# Patient Record
Sex: Female | Born: 1945 | Race: White | Hispanic: No | Marital: Married | Smoking: Former smoker
Health system: Southern US, Community
[De-identification: ages and names within clinical notes are randomized; demographics above are authoritative.]

## PROBLEM LIST (undated history)

## (undated) DIAGNOSIS — K529 Noninfective gastroenteritis and colitis, unspecified: Secondary | ICD-10-CM

## (undated) DIAGNOSIS — C4490 Unspecified malignant neoplasm of skin, unspecified: Secondary | ICD-10-CM

## (undated) DIAGNOSIS — Z87898 Personal history of other specified conditions: Secondary | ICD-10-CM

## (undated) DIAGNOSIS — M419 Scoliosis, unspecified: Secondary | ICD-10-CM

## (undated) DIAGNOSIS — M1990 Unspecified osteoarthritis, unspecified site: Secondary | ICD-10-CM

## (undated) DIAGNOSIS — I10 Essential (primary) hypertension: Secondary | ICD-10-CM

## (undated) DIAGNOSIS — M51369 Other intervertebral disc degeneration, lumbar region without mention of lumbar back pain or lower extremity pain: Secondary | ICD-10-CM

## (undated) DIAGNOSIS — C801 Malignant (primary) neoplasm, unspecified: Secondary | ICD-10-CM

## (undated) DIAGNOSIS — K219 Gastro-esophageal reflux disease without esophagitis: Secondary | ICD-10-CM

## (undated) DIAGNOSIS — M797 Fibromyalgia: Secondary | ICD-10-CM

## (undated) DIAGNOSIS — F32A Depression, unspecified: Secondary | ICD-10-CM

## (undated) DIAGNOSIS — M5136 Other intervertebral disc degeneration, lumbar region: Secondary | ICD-10-CM

## (undated) DIAGNOSIS — E785 Hyperlipidemia, unspecified: Secondary | ICD-10-CM

## (undated) DIAGNOSIS — R9082 White matter disease, unspecified: Secondary | ICD-10-CM

## (undated) DIAGNOSIS — J329 Chronic sinusitis, unspecified: Secondary | ICD-10-CM

## (undated) DIAGNOSIS — F419 Anxiety disorder, unspecified: Secondary | ICD-10-CM

## (undated) HISTORY — PX: OTHER SURGICAL HISTORY: SHX169

## (undated) HISTORY — PX: TUBAL LIGATION: SHX77

## (undated) HISTORY — PX: INNER EAR SURGERY: SHX679

## (undated) HISTORY — PX: COLONOSCOPY: SHX174

## (undated) HISTORY — DX: Malignant (primary) neoplasm, unspecified: C80.1

## (undated) HISTORY — PX: ABDOMINAL HYSTERECTOMY: SHX81

## (undated) HISTORY — PX: HERNIA REPAIR: SHX51

---

## 1997-10-15 HISTORY — PX: ROTATOR CUFF REPAIR: SHX139

## 2004-10-15 HISTORY — PX: EYELID LACERATION REPAIR: SHX1564

## 2008-10-15 HISTORY — PX: CHOLECYSTECTOMY: SHX55

## 2008-11-06 ENCOUNTER — Emergency Department: Payer: Self-pay | Admitting: Emergency Medicine

## 2009-03-23 IMAGING — CR LEFT WRIST - COMPLETE 3+ VIEW
1 series · 4 of 4 positions shown · non-contrast
Comparison: none

REASON FOR EXAM: FALL/SWELLING AND PAIN
COMMENTS:

[Series 1: view not recorded · 0.17mm/px · 4 of 4 slices shown]
[im 1/4]
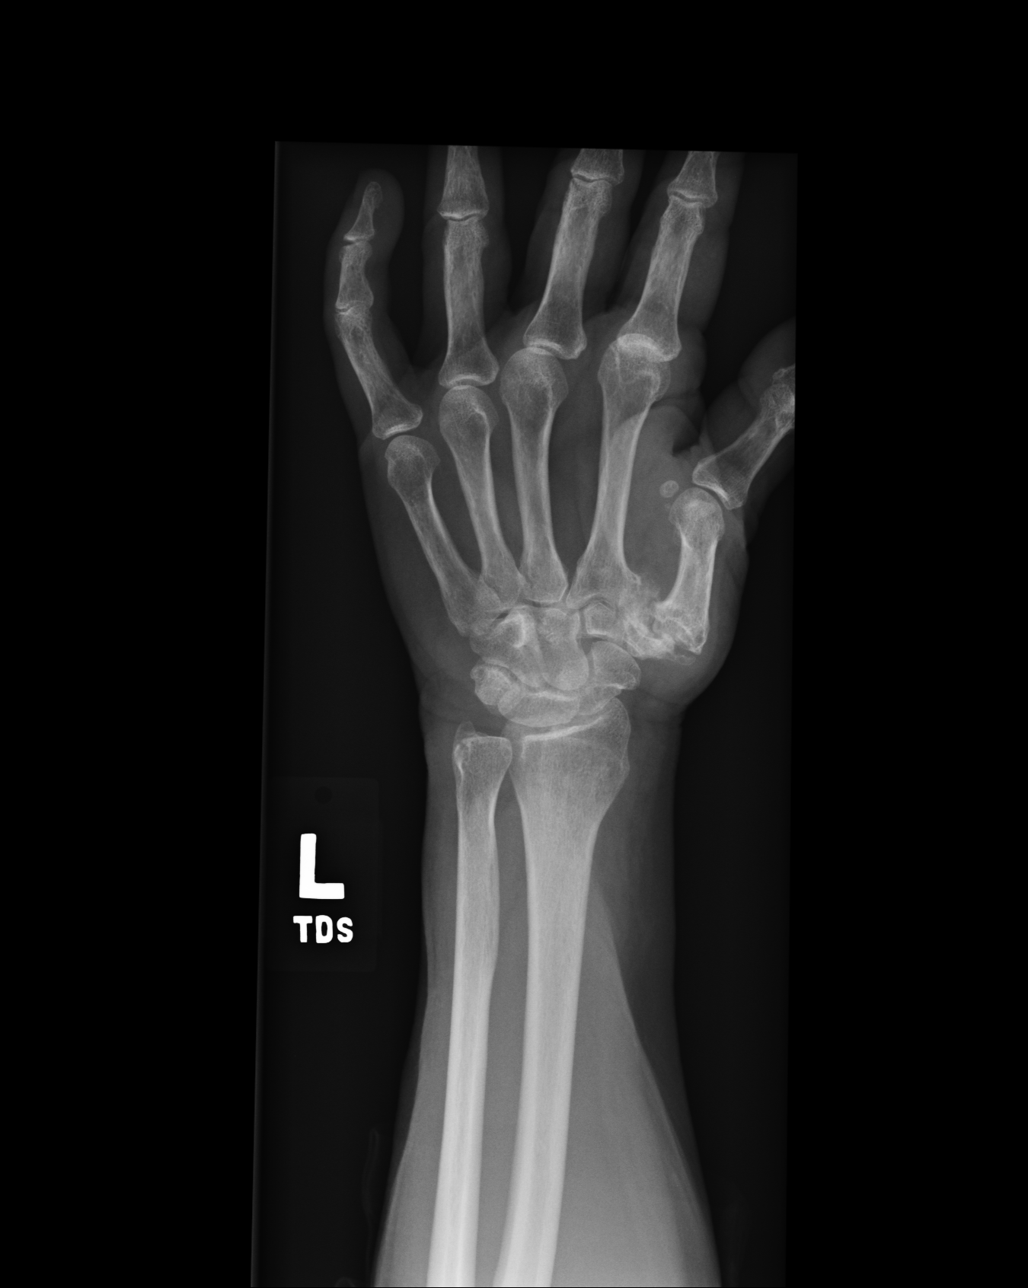
[im 2/4]
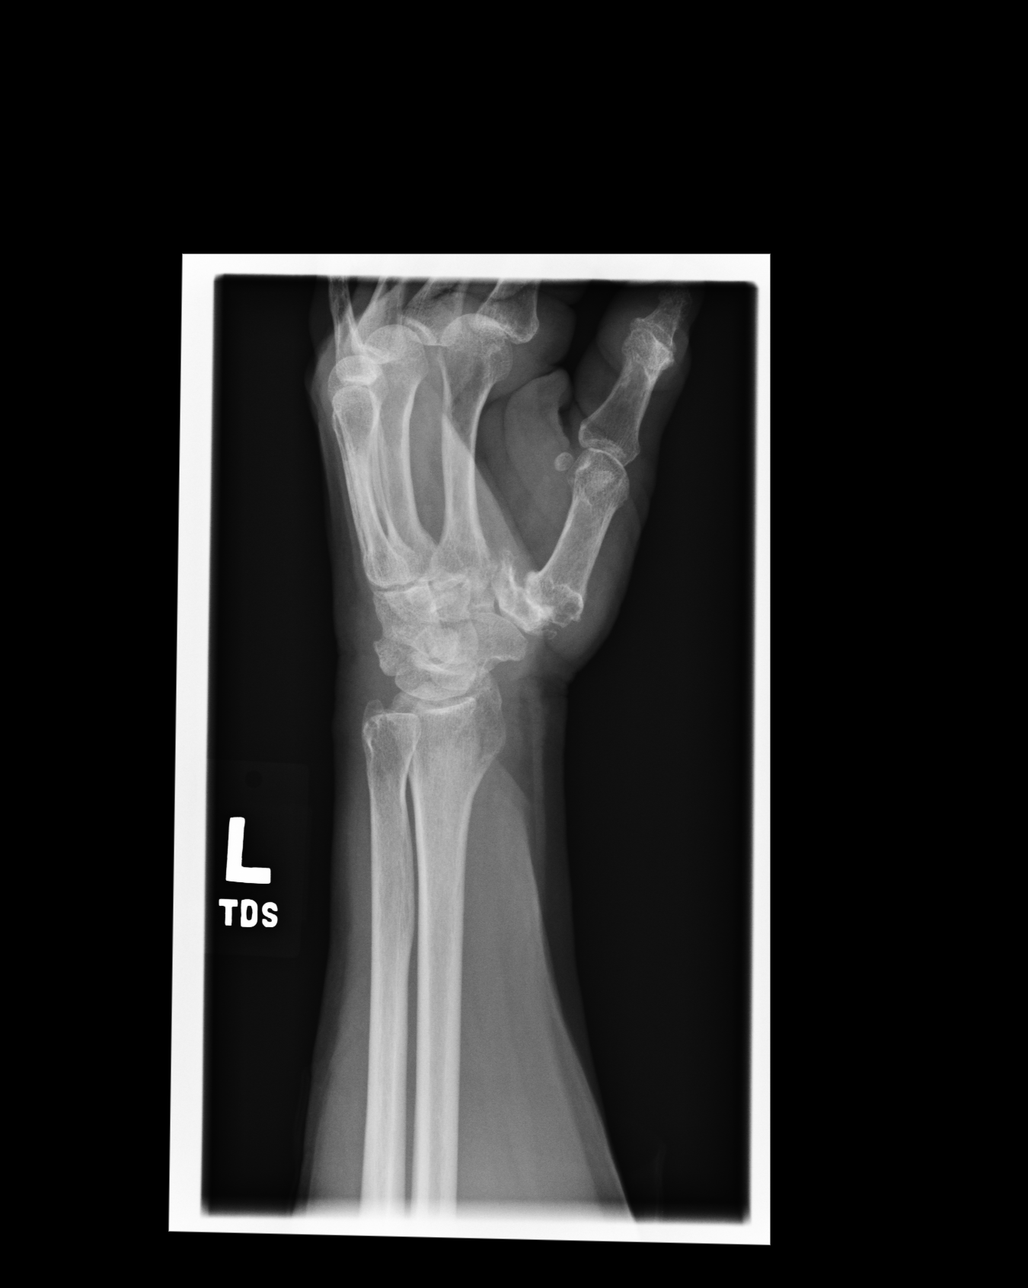
[im 3/4]
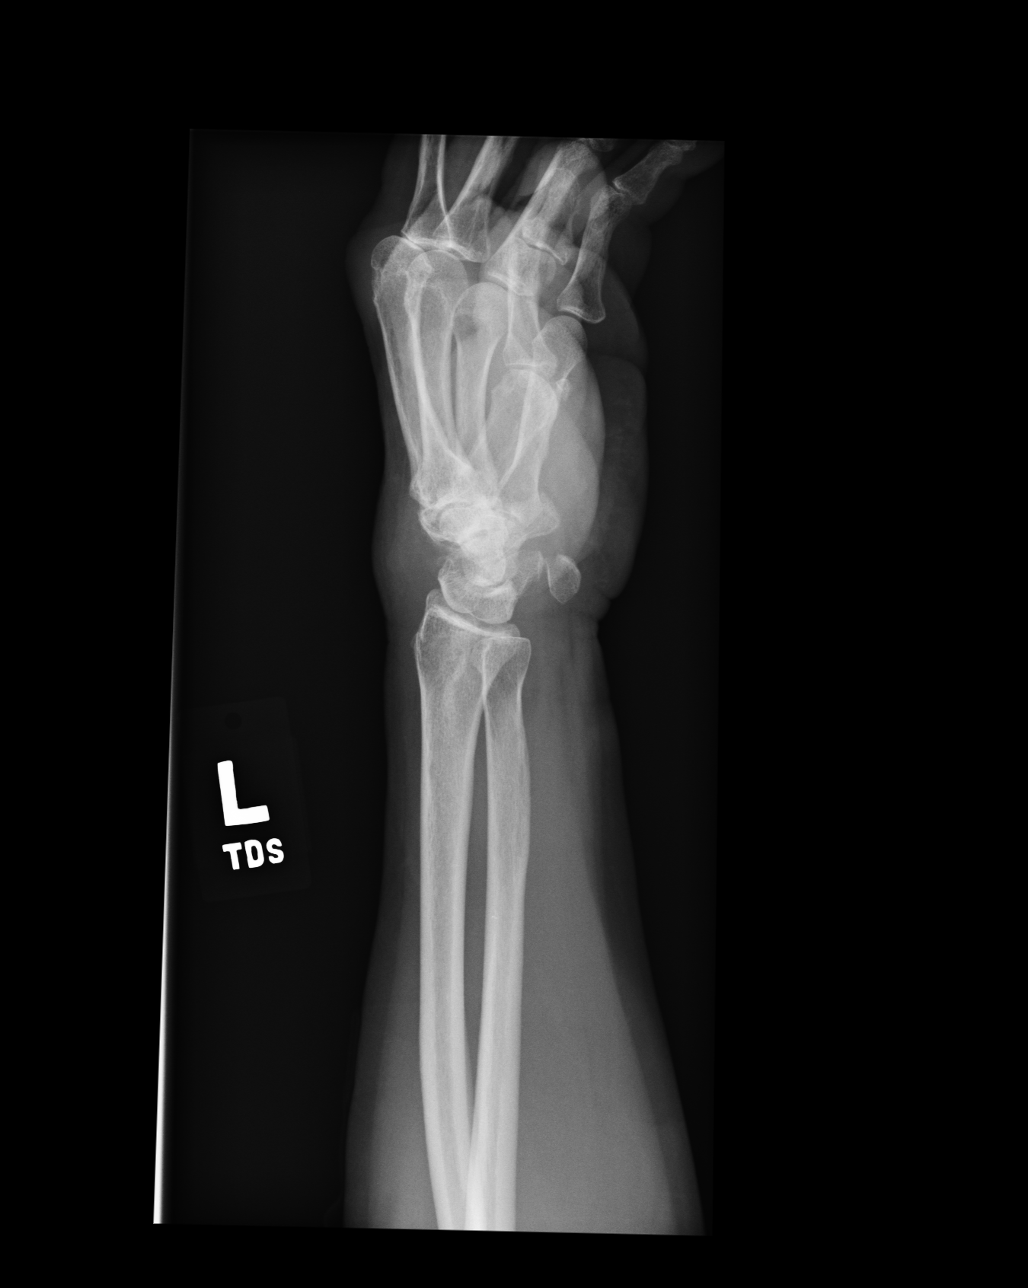
[im 4/4]
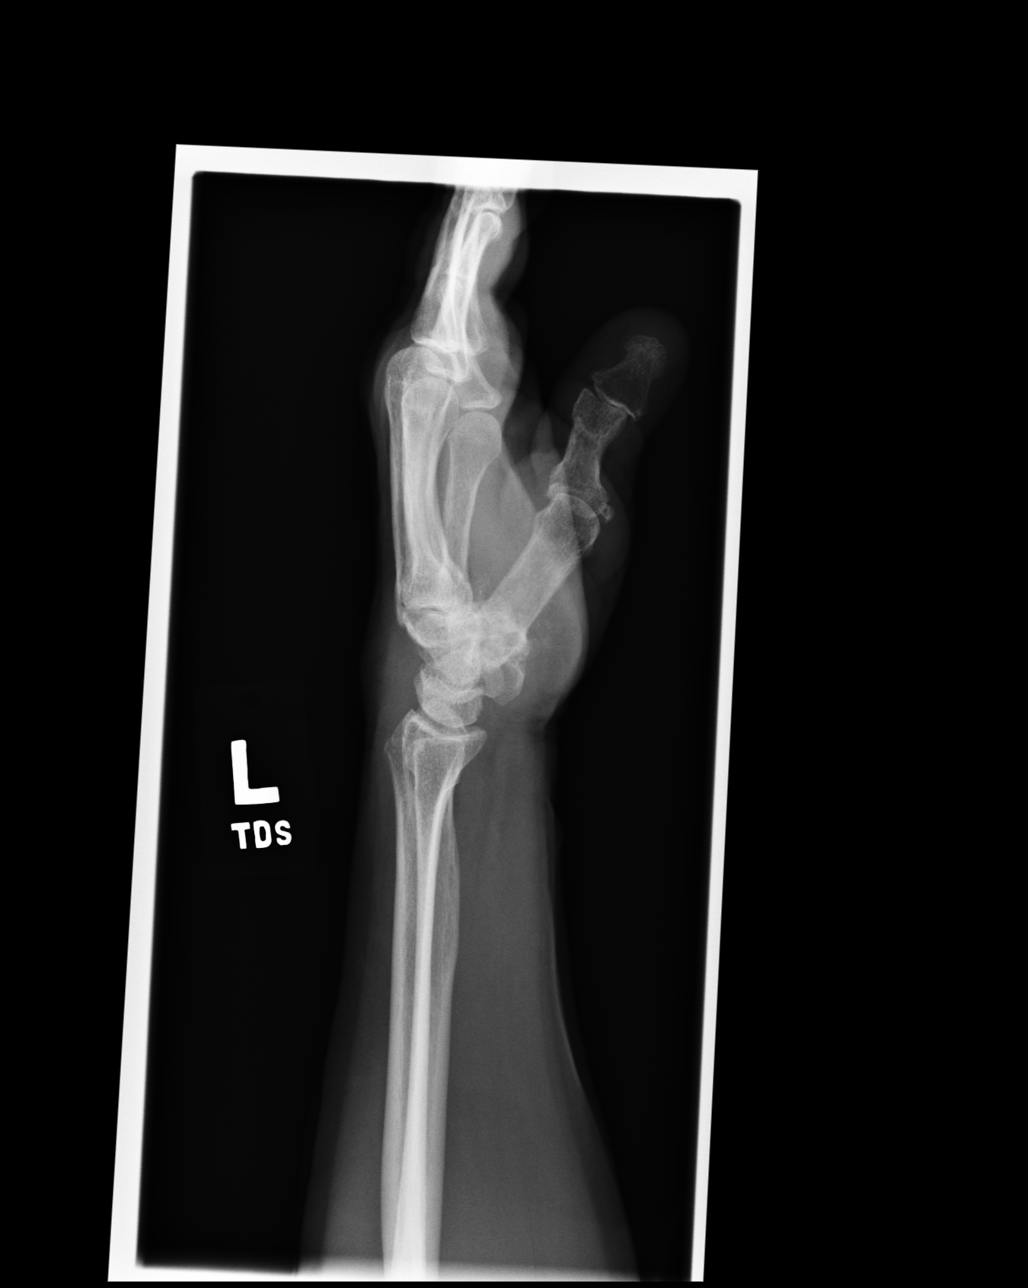

[4 of 4 positions shown; findings below may reference images not displayed]

PROCEDURE:     DXR - DXR WRIST LT COMP WITH OBLIQUES  - November 06, 2008  [DATE]

RESULT:     Distal radial fracture is present.  Distal ulna fracture is also
present. Severe degenerative changes are noted at the base of the thumb at
the first carpometacarpal joint. Fragmentation of the adjacent carpal
suggesting avascular necrosis cannot be excluded.
IMPRESSION: Subtle fractures of the distal radius and ulna.

This report was phoned to the patient's physician.  The report was phoned at
the time of the study.

## 2010-05-23 ENCOUNTER — Emergency Department: Payer: Self-pay | Admitting: Emergency Medicine

## 2013-10-15 HISTORY — PX: JOINT REPLACEMENT: SHX530

## 2013-11-12 HISTORY — PX: TOTAL HIP ARTHROPLASTY: SHX124

## 2015-09-01 DIAGNOSIS — Z9889 Other specified postprocedural states: Secondary | ICD-10-CM

## 2015-09-01 HISTORY — DX: Other specified postprocedural states: Z98.890

## 2015-10-10 ENCOUNTER — Encounter: Payer: Self-pay | Admitting: Emergency Medicine

## 2015-10-10 ENCOUNTER — Ambulatory Visit
Admission: EM | Admit: 2015-10-10 | Discharge: 2015-10-10 | Disposition: A | Discharge status: Home / Self Care | Payer: Medicare Other | Attending: Family Medicine | Admitting: Family Medicine

## 2015-10-10 DIAGNOSIS — J0100 Acute maxillary sinusitis, unspecified: Secondary | ICD-10-CM | POA: Diagnosis not present

## 2015-10-10 HISTORY — DX: Essential (primary) hypertension: I10

## 2015-10-10 MED ORDER — DOXYCYCLINE HYCLATE 100 MG PO CAPS: 100.0000 mg | ORAL_CAPSULE | Freq: Two times a day (BID) | ORAL | Status: DC

## 2015-10-10 MED ORDER — GUAIFENESIN-CODEINE 100-10 MG/5ML PO SOLN: 5.0000 mL | Freq: Three times a day (TID) | ORAL | Status: DC | PRN

## 2015-10-10 MED ORDER — PREDNISONE 20 MG PO TABS: 40.0000 mg | ORAL_TABLET | Freq: Every day | ORAL | Status: DC

## 2015-10-10 NOTE — Discharge Instructions (Signed)
Take medication as prescribed. Rest.   Follow up closely with your primary care physician this week. Return to Urgent care or proceed to ER for chest pain, shortness of breath, weakness, fever, new or worsening concerns.   Sinusitis, Adult Sinusitis is redness, soreness, and inflammation of the paranasal sinuses. Paranasal sinuses are air pockets within the bones of your face. They are located beneath your eyes, in the middle of your forehead, and above your eyes. In healthy paranasal sinuses, mucus is able to drain out, and air is able to circulate through them by way of your nose. However, when your paranasal sinuses are inflamed, mucus and air can become trapped. This can allow bacteria and other germs to grow and cause infection. Sinusitis can develop quickly and last only a short time (acute) or continue over a long period (chronic). Sinusitis that lasts for more than 12 weeks is considered chronic. CAUSES Causes of sinusitis include:  Allergies.  Structural abnormalities, such as displacement of the cartilage that separates your nostrils (deviated septum), which can decrease the air flow through your nose and sinuses and affect sinus drainage.  Functional abnormalities, such as when the small hairs (cilia) that line your sinuses and help remove mucus do not work properly or are not present. SIGNS AND SYMPTOMS Symptoms of acute and chronic sinusitis are the same. The primary symptoms are pain and pressure around the affected sinuses. Other symptoms include:  Upper toothache.  Earache.  Headache.  Bad breath.  Decreased sense of smell and taste.  A cough, which worsens when you are lying flat.  Fatigue.  Fever.  Thick drainage from your nose, which often is green and may contain pus (purulent).  Swelling and warmth over the affected sinuses. DIAGNOSIS Your health care provider will perform a physical exam. During your exam, your health care provider may perform any of the  following to help determine if you have acute sinusitis or chronic sinusitis:  Look in your nose for signs of abnormal growths in your nostrils (nasal polyps).  Tap over the affected sinus to check for signs of infection.  View the inside of your sinuses using an imaging device that has a light attached (endoscope). If your health care provider suspects that you have chronic sinusitis, one or more of the following tests may be recommended:  Allergy tests.  Nasal culture. A sample of mucus is taken from your nose, sent to a lab, and screened for bacteria.  Nasal cytology. A sample of mucus is taken from your nose and examined by your health care provider to determine if your sinusitis is related to an allergy. TREATMENT Most cases of acute sinusitis are related to a viral infection and will resolve on their own within 10 days. Sometimes, medicines are prescribed to help relieve symptoms of both acute and chronic sinusitis. These may include pain medicines, decongestants, nasal steroid sprays, or saline sprays. However, for sinusitis related to a bacterial infection, your health care provider will prescribe antibiotic medicines. These are medicines that will help kill the bacteria causing the infection. Rarely, sinusitis is caused by a fungal infection. In these cases, your health care provider will prescribe antifungal medicine. For some cases of chronic sinusitis, surgery is needed. Generally, these are cases in which sinusitis recurs more than 3 times per year, despite other treatments. HOME CARE INSTRUCTIONS  Drink plenty of water. Water helps thin the mucus so your sinuses can drain more easily.  Use a humidifier.  Inhale steam 3-4 times a  day (for example, sit in the bathroom with the shower running).  Apply a warm, moist washcloth to your face 3-4 times a day, or as directed by your health care provider.  Use saline nasal sprays to help moisten and clean your sinuses.  Take  medicines only as directed by your health care provider.  If you were prescribed either an antibiotic or antifungal medicine, finish it all even if you start to feel better. SEEK IMMEDIATE MEDICAL CARE IF:  You have increasing pain or severe headaches.  You have nausea, vomiting, or drowsiness.  You have swelling around your face.  You have vision problems.  You have a stiff neck.  You have difficulty breathing.   This information is not intended to replace advice given to you by your health care provider. Make sure you discuss any questions you have with your health care provider.   Document Released: 10/01/2005 Document Revised: 10/22/2014 Document Reviewed: 10/16/2011 Elsevier Interactive Patient Education [employer] Inc[employer]

## 2015-10-10 NOTE — ED Provider Notes (Signed)
[facility]  ____________________________________________  Time seen: Approximately 5:46 PM  I have reviewed the triage vital signs and the nursing notes.   HISTORY  Chief Complaint Cough; Facial Pain; and Nasal Congestion   HPI Julia Sherman is a 69 y.o. female presents with spouse at bedside for the complaints of 1-2 weeks of runny nose, nasal congestion, sinus pressure and sinus drainage. Reports frequently getting thick yellow to green sinus drainage when blowing nose. Reports ears feel clogged, Denies hearing changes. Reports occasionally productive cough. Patient reports feeling drainage in the back of her throat. States cough is worse at night. Denies fevers.   Denies chest pain, shortness of breath, wheezing, abdominal pain, vision changes, weakness or dizziness. Reports continues to eat and drink well. Reports most recent sickness was bronchitis approximately 2 months ago treated with oral Augmentin.   Reports PCP Dr. Mylinda Latina. Reports tried to follow-up with him today but office is closed.   Past Medical History  Diagnosis Date  . Hypertension    chronic bilateral lower extremity venous stasis ulcers, followed by 1 management with Unna boots.  There are no active problems to display for this patient.   History reviewed. No pertinent past surgical history.  Current Outpatient Rx  Name  Route  Sig  Dispense  Refill  . alendronate (FOSAMAX) 70 MG tablet   Oral   Take 70 mg by mouth once a week. Take with a full glass of water on an empty stomach.         . ALPRAZolam (XANAX) 0.25 MG tablet   Oral   Take 0.25 mg by mouth at bedtime.         . calcium-vitamin D (OSCAL WITH D) 500-200 MG-UNIT tablet   Oral   Take 1 tablet by mouth daily with breakfast.         . cyclobenzaprine (FLEXERIL) 10 MG tablet   Oral   Take 10 mg by mouth 3 (three) times daily as needed for muscle spasms.         Marland Kitchen escitalopram (LEXAPRO) 10 MG tablet   Oral   Take 10 mg by  mouth daily.         . furosemide (LASIX) 20 MG tablet   Oral   Take 20 mg by mouth.         . gabapentin (NEURONTIN) 100 MG capsule   Oral   Take 100 mg by mouth 3 (three) times daily.         . hydrochlorothiazide (HYDRODIURIL) 25 MG tablet   Oral   Take 25 mg by mouth daily.         Marland Kitchen omeprazole (PRILOSEC) 20 MG capsule   Oral   Take 20 mg by mouth daily.         . potassium chloride (K-DUR,KLOR-CON) 10 MEQ tablet   Oral   Take 10 mEq by mouth 2 (two) times daily.         . vitamin C (ASCORBIC ACID) 500 MG tablet   Oral   Take 500 mg by mouth daily.         .           .           .             Allergies Novocain  History reviewed. No pertinent family history.  Social History Social History  Substance Use Topics  . Smoking status: Current Every Day Smoker    Types:  Cigarettes  . Smokeless tobacco: None  . Alcohol Use: No    Review of Systems Constitutional: No fever/chills Eyes: No visual changes. ENT: No sore throat. Positive runny nose, nasal congestion, and intermittent cough.  Cardiovascular: Denies chest pain. Respiratory: Denies shortness of breath. Gastrointestinal: No abdominal pain.  No nausea, no vomiting.  No diarrhea.  No constipation. Genitourinary: Negative for dysuria. Musculoskeletal: Negative for back pain. Skin: Negative for rash. Neurological: Negative for headaches, focal weakness or numbness.  10-point ROS otherwise negative.  ____________________________________________   PHYSICAL EXAM:  VITAL SIGNS: ED Triage Vitals  Enc Vitals Group     BP 10/10/15 1538 127/70 mmHg     Pulse Rate 10/10/15 1538 92     Resp 10/10/15 1538 17     Temp 10/10/15 1538 98.7 F (37.1 C)     Temp Source 10/10/15 1538 Tympanic     SpO2 10/10/15 1538 95 %     Weight 10/10/15 1538 150 lb (68.04 kg)     Height 10/10/15 1538 5' (1.524 m)     Head Cir --      Peak Flow --      Pain Score --      Pain Loc --      Pain Edu? --       Excl. in [place]? --     Constitutional: Alert and oriented. Well appearing and in no acute distress. Eyes: Conjunctivae are normal. PERRL. EOMI. Head: Atraumatic.mod TTP maxillary sinuses, no frontal sinus TTP, no swelling, no erythema.   Ears: no erythema, normal TMs bilaterally. Hearing grossly intact bilaterally.  Nose: nasal congestion, nasal turbinate erythema and edema with greenish nasal drainage.   Mouth/Throat: Mucous membranes are moist.  Oropharynx non-erythematous. No tonsillar swelling or exudate. Neck: No stridor.  No cervical spine tenderness to palpation. Hematological/Lymphatic/Immunilogical: No cervical lymphadenopathy. Cardiovascular: Normal rate, regular rhythm. Grossly normal heart sounds.  Good peripheral circulation. Respiratory: Normal respiratory effort.  No retractions. Lungs CTAB. No wheezes, rales or rhonchi. Good air movement. Dry intermittent cough in room. Gastrointestinal: Soft and nontender. Musculoskeletal: No lower or upper extremity tenderness nor edema. Bilateral lower extremity Unna boots present. Neurologic:  Normal speech and language. No gross focal neurologic deficits are appreciated. No gait instability. Skin:  Skin is warm, dry and intact. No rash noted. Psychiatric: Mood and affect are normal. Speech and behavior are normal.  ____________________________________________   LABS (all labs ordered are listed, but only abnormal results are displayed)  Labs Reviewed - No data to display  INITIAL IMPRESSION / ASSESSMENT AND PLAN / ED COURSE  Pertinent labs & imaging results that were available during my care of the patient were reviewed by me and considered in my medical decision making (see chart for details).  Well-appearing patient. No acute distress. Presents with husband at bedside. Presents for the complaints of 1-2 weeks of nasal congestion, runny nose, purulent green nasal drainage. Reports intermittent cough. States cough is primarily a dry  cough. Denies chest pain, shortness of breath, wheezing or weakness. Reports continues to eat and drink well. Denies known fevers.  Will treat acute maxillary sinusitis with oral doxycycline, encouraged supportive treatments rest, fluids, PCP follow-up. Also treat with 5 day course of prednisone and when necessary guaifenesin with codeine. Strict follow-up and return parameters given. Patient and spouse verbalized understanding and agreed to plan.   Discussed follow up with Primary care physician this week. Discussed follow up and return parameters including no resolution or any worsening concerns. Patient verbalized  understanding and agreed to plan.   ____________________________________________   FINAL CLINICAL IMPRESSION(S) / ED DIAGNOSES  Final diagnoses:  Acute maxillary sinusitis, recurrence not specified       Renford DillsLindsey Spielmann, NP 10/10/15 1759

## 2015-10-10 NOTE — ED Notes (Signed)
Patient c/o sinus congestion, cough, chest congestion, and nasal congestion for 1-2 weeks.

## 2015-10-16 HISTORY — PX: HIATAL HERNIA REPAIR: SHX195

## 2016-08-06 DIAGNOSIS — M810 Age-related osteoporosis without current pathological fracture: Secondary | ICD-10-CM | POA: Insufficient documentation

## 2017-02-01 HISTORY — PX: ESOPHAGOGASTRODUODENOSCOPY: SHX1529

## 2017-04-04 DIAGNOSIS — F323 Major depressive disorder, single episode, severe with psychotic features: Secondary | ICD-10-CM | POA: Insufficient documentation

## 2018-03-15 DEATH — deceased

## 2019-03-30 ENCOUNTER — Ambulatory Visit: Payer: Medicare Other

## 2019-03-30 ENCOUNTER — Other Ambulatory Visit: Payer: Self-pay

## 2019-03-30 ENCOUNTER — Ambulatory Visit
Admission: EM | Admit: 2019-03-30 | Discharge: 2019-03-30 | Disposition: A | Discharge status: Home / Self Care | Payer: Medicare Other | Attending: Emergency Medicine | Admitting: Emergency Medicine

## 2019-03-30 DIAGNOSIS — R609 Edema, unspecified: Secondary | ICD-10-CM | POA: Diagnosis not present

## 2019-03-30 DIAGNOSIS — Z87898 Personal history of other specified conditions: Secondary | ICD-10-CM | POA: Insufficient documentation

## 2019-03-30 DIAGNOSIS — R0602 Shortness of breath: Secondary | ICD-10-CM | POA: Diagnosis not present

## 2019-03-30 DIAGNOSIS — L853 Xerosis cutis: Secondary | ICD-10-CM

## 2019-03-30 LAB — COMPREHENSIVE METABOLIC PANEL
ALT: 14 U/L (ref 0–44)
AST: 22 U/L (ref 15–41)
Albumin: 4 g/dL (ref 3.5–5.0)
Alkaline Phosphatase: 69 U/L (ref 38–126)
Anion gap: 10 (ref 5–15)
BUN: 23 mg/dL (ref 8–23)
CO2: 24 mmol/L (ref 22–32)
Calcium: 8.8 mg/dL — ABNORMAL LOW (ref 8.9–10.3)
Chloride: 105 mmol/L (ref 98–111)
Creatinine, Ser: 0.95 mg/dL (ref 0.44–1.00)
GFR calc Af Amer: >60 mL/min (ref >60)
GFR calc non Af Amer: >60 mL/min (ref >60)
Glucose, Bld: 95 mg/dL (ref 70–99)
Potassium: 3.7 mmol/L (ref 3.5–5.1)
Sodium: 139 mmol/L (ref 135–145)
Total Bilirubin: 0.5 mg/dL (ref 0.3–1.2)
Total Protein: 7.4 g/dL (ref 6.5–8.1)

## 2019-03-30 LAB — CBC WITH DIFFERENTIAL/PLATELET
Abs Immature Granulocytes: 0.03 10*3/uL (ref 0.00–0.07)
Basophils Absolute: 0.1 10*3/uL (ref 0.0–0.1)
Basophils Relative: 1 %
Eosinophils Absolute: 0.2 10*3/uL (ref 0.0–0.5)
Eosinophils Relative: 3 %
HCT: 41.9 % (ref 36.0–46.0)
Hemoglobin: 13.2 g/dL (ref 12.0–15.0)
Immature Granulocytes: 1 %
Lymphocytes Relative: 21 %
Lymphs Abs: 1.3 10*3/uL (ref 0.7–4.0)
MCH: 32.2 pg (ref 26.0–34.0)
MCHC: 31.5 g/dL (ref 30.0–36.0)
MCV: 102.2 fL — ABNORMAL HIGH (ref 80.0–100.0)
Monocytes Absolute: 0.7 10*3/uL (ref 0.1–1.0)
Monocytes Relative: 11 %
Neutro Abs: 4.1 10*3/uL (ref 1.7–7.7)
Neutrophils Relative %: 63 %
Platelets: 153 10*3/uL (ref 150–400)
RBC: 4.1 MIL/uL (ref 3.87–5.11)
RDW: 14.5 % (ref 11.5–15.5)
WBC: 6.4 10*3/uL (ref 4.0–10.5)
nRBC: 0 % (ref 0.0–0.2)

## 2019-03-30 LAB — BRAIN NATRIURETIC PEPTIDE: B Natriuretic Peptide: 123 pg/mL — ABNORMAL HIGH (ref 0.0–100.0)

## 2019-03-30 MED ORDER — FUROSEMIDE 40 MG PO TABS: 40.0000 mg | ORAL_TABLET | Freq: Every day | ORAL | 0 refills | Status: DC

## 2019-03-30 NOTE — ED Triage Notes (Signed)
Patient complain of rash on her face and lower back that has been itchy. Patient states that rash has been itchy. Patient states that she is concerned this is shingles.  Patient states that thinks she might have had covid back in January. Patient states that she has had inflammation in her legs since Thursday and has not felt well and is concerned this could be covid again because her daughter told her that was a sign of covid.

## 2019-03-30 NOTE — ED Provider Notes (Signed)
[facility]    CSN: [id number] Arrival date & time: 03/30/19  1535     History   Chief Complaint Chief Complaint  Patient presents with  . Rash    HPI Julia Sherman is a 73 y.o. female presenting with a possible rash to back, overall malaise and leg swelling. Pt has "been feeling bad" since January and believes she had coronavirus at that time. Pt did not get tested or follow-up with PCP during that time. Pt states she's feels the rash in her back has been getting bad "over these few days"  and is concerned for shingles.  Leg swelling has also increased over the course of these past weeks. Pt notices that she has been getting more short of breath with exertion. Denies history of heart failure, COPD. Pt also states that she hasn't been taking lasix as prescribed since January.   Pt is a poor hostorian and is hard of hearing, therefore HPI is limited.  Past Medical History:  Diagnosis Date  . Hypertension     There are no active problems to display for this patient.   History reviewed. No pertinent surgical history.  OB History   No obstetric history on file.      Home Medications    Prior to Admission medications   Medication Sig Start Date End Date Taking? Authorizing Provider  furosemide (LASIX) 20 MG tablet Take 20 mg by mouth.   Yes [provider]  sertraline (ZOLOFT) 50 MG tablet Take 50 mg by mouth daily.   Yes [provider]  alendronate (FOSAMAX) 70 MG tablet Take 70 mg by mouth once a week. Take with a full glass of water on an empty stomach.    [provider]  ALPRAZolam Prudy Feeler(XANAX) 0.25 MG tablet Take 0.25 mg by mouth at bedtime.    [provider]  calcium-vitamin D (OSCAL WITH D) 500-200 MG-UNIT tablet Take 1 tablet by mouth daily with breakfast.    [provider]  cyclobenzaprine (FLEXERIL) 10 MG tablet Take 10 mg by mouth 3 (three) times daily as needed for muscle spasms.    [provider]   doxycycline (VIBRAMYCIN) 100 MG capsule Take 1 capsule (100 mg total) by mouth 2 (two) times daily. 10/10/15   Renford DillsMiller, Lindsey, NP  escitalopram (LEXAPRO) 10 MG tablet Take 10 mg by mouth daily.    [provider]  furosemide (LASIX) 40 MG tablet Take 1 tablet (40 mg total) by mouth daily for 5 days. Then restart previous lasix prescription. 03/30/19 04/04/19  Bailey MechBenjamin, Amando Chaput, NP  gabapentin (NEURONTIN) 100 MG capsule Take 100 mg by mouth 3 (three) times daily.    [provider]  guaiFENesin-codeine 100-10 MG/5ML syrup Take 5 mLs by mouth 3 (three) times daily as needed for cough. 10/10/15   Renford DillsMiller, Lindsey, NP  hydrochlorothiazide (HYDRODIURIL) 25 MG tablet Take 25 mg by mouth daily.    [provider]  omeprazole (PRILOSEC) 20 MG capsule Take 20 mg by mouth daily.    [provider]  potassium chloride (K-DUR,KLOR-CON) 10 MEQ tablet Take 10 mEq by mouth 2 (two) times daily.    [provider]  predniSONE (DELTASONE) 20 MG tablet Take 2 tablets (40 mg total) by mouth daily. 10/10/15   Renford DillsMiller, Lindsey, NP  vitamin C (ASCORBIC ACID) 500 MG tablet Take 500 mg by mouth daily.    [provider]    Family History History reviewed. No pertinent family history.  Social History Social History  Tobacco Use  . Smoking status: Current Every Day Smoker    Packs/day: 1.00    Types: Cigarettes  . Smokeless tobacco: Never Used  Substance Use Topics  . Alcohol use: No    Comment: occasionally  . Drug use: Not Currently     Allergies   Novocain [procaine]   Review of Systems Review of Systems  Constitutional: Positive for activity change and fatigue. Negative for diaphoresis and fever.  Respiratory: Positive for shortness of breath. Negative for apnea and chest tightness.   Gastrointestinal: Negative for abdominal distention and abdominal pain.  Skin: Positive for rash.  Neurological: Positive for weakness.     Physical Exam Triage  Vital Signs ED Triage Vitals  Enc Vitals Group     BP 03/30/19 1628 126/69     Pulse Rate 03/30/19 1628 70     Resp 03/30/19 1628 18     Temp 03/30/19 1628 98.2 F (36.8 C)     Temp Source 03/30/19 1628 Oral     SpO2 03/30/19 1628 97 %     Weight 03/30/19 1624 150 lb (68 kg)     Height 03/30/19 1624 4\' 9"  (1.448 m)     Head Circumference --      Peak Flow --      Pain Score 03/30/19 1621 6     Pain Loc --      Pain Edu? --      Excl. in [place]? --    No data found.  Updated Vital Signs BP 126/69 (BP Location: Left Arm)   Pulse 70   Temp 98.2 F (36.8 C) (Oral)   Resp 18   Ht 4\' 9"  (1.448 m)   Wt 150 lb (68 kg)   SpO2 97%   BMI 32.46 kg/m   Visual Acuity Right Eye Distance:   Left Eye Distance:   Bilateral Distance:    Right Eye Near:   Left Eye Near:    Bilateral Near:     Physical Exam Vitals signs and nursing note reviewed.  Cardiovascular:     Rate and Rhythm: Regular rhythm.     Pulses: Normal pulses.     Heart sounds: No murmur.  Pulmonary:     Effort: No respiratory distress.     Breath sounds: Rhonchi present. No wheezing.  Skin:    General: Skin is warm and dry.     Comments: Dry dermatitis to back. No additional rash visualized  Neurological:     Mental Status: She is alert.      UC Treatments / Results  Labs (all labs ordered are listed, but only abnormal results are displayed) Labs Reviewed  COMPREHENSIVE METABOLIC PANEL - Abnormal; Notable for the following components:      Result Value   Calcium 8.8 (*)    All other components within normal limits  CBC WITH DIFFERENTIAL/PLATELET - Abnormal; Notable for the following components:   MCV 102.2 (*)    All other components within normal limits  BRAIN NATRIURETIC PEPTIDE    EKG None  Radiology Dg Chest 2 View  Result Date: 03/30/2019 CLINICAL DATA:  Upper respiratory infection symptoms on and off since January 2020. EXAM: CHEST - 2 VIEW COMPARISON:  05/23/2010 FINDINGS: The cardiac  silhouette, mediastinal and hilar contours are within normal limits. There is mild tortuosity and calcification of the thoracic aorta. Minimal streaky basilar scarring changes but no infiltrates, edema or effusions. No worrisome pulmonary lesions. The bony structures are intact. There are surgical changes involving  the right shoulder with loose bone anchors in the humerus noted. IMPRESSION: Streaky bibasilar atelectasis but no infiltrates, edema or effusions. Electronically Signed   By: Rudie MeyerP.  Gallerani M.D.   On: 03/30/2019 18:00    Procedures Procedures (including critical care time)  Medications Ordered in UC Medications - No data to display  Initial Impression / Assessment and Plan / UC Course  I have reviewed the triage vital signs and the nursing notes.  Pertinent labs & imaging results that were available during my care of the patient were reviewed by me and considered in my medical decision making (see chart for details).  Pt presents with multiple complaints. Pt diagnosed with peripheral edema and dry skin dermatitis consistent with complaints above. Pt to follow-up with a PCP ASAP. All questions answered and all concerns addressed.   Final Clinical Impressions(s) / UC Diagnoses   Final diagnoses:  Dry skin  History of peripheral edema  Peripheral edema   Discharge Instructions   None    ED Prescriptions    Medication Sig Dispense Auth. Provider   furosemide (LASIX) 40 MG tablet Take 1 tablet (40 mg total) by mouth daily for 5 days. Then restart previous lasix prescription. 5 tablet Bailey MechBenjamin, Ranferi Clingan, NP       Bailey MechBenjamin, Aayden Cefalu, NP 03/30/19 [id number]

## 2019-04-14 ENCOUNTER — Encounter (HOSPITAL_COMMUNITY): Payer: Self-pay | Admitting: Internal Medicine

## 2019-04-30 ENCOUNTER — Ambulatory Visit: Payer: Medicare Other | Admitting: Internal Medicine

## 2019-04-30 DIAGNOSIS — Z0289 Encounter for other administrative examinations: Secondary | ICD-10-CM

## 2019-08-14 IMAGING — CR CHEST - 2 VIEW
2 series · 3 of 3 positions shown · non-contrast
Comparison: 05/23/2010

CLINICAL DATA: Upper respiratory infection symptoms on and off
since October 2018.

EXAM:
CHEST - 2 VIEW

[Series 1: chest pa · 0.14mm/px · 2 of 2 slices shown]
[im 1/2]
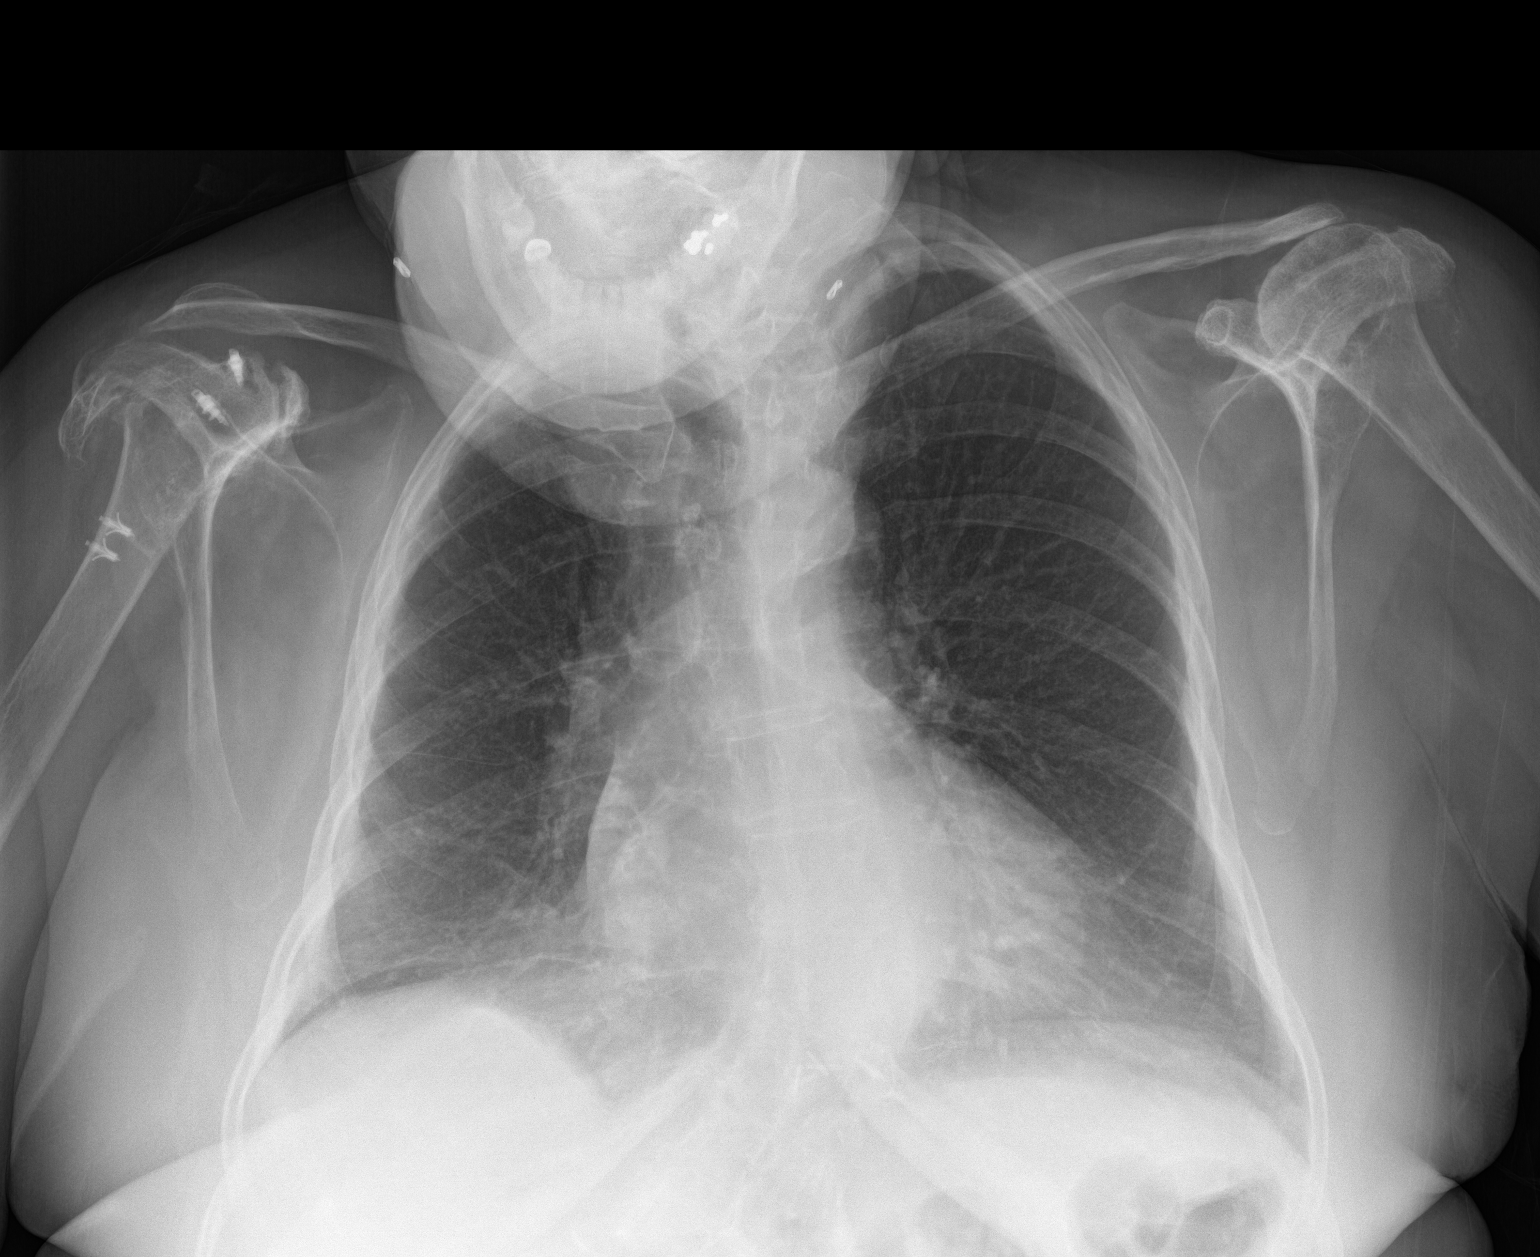
[im 2/2]
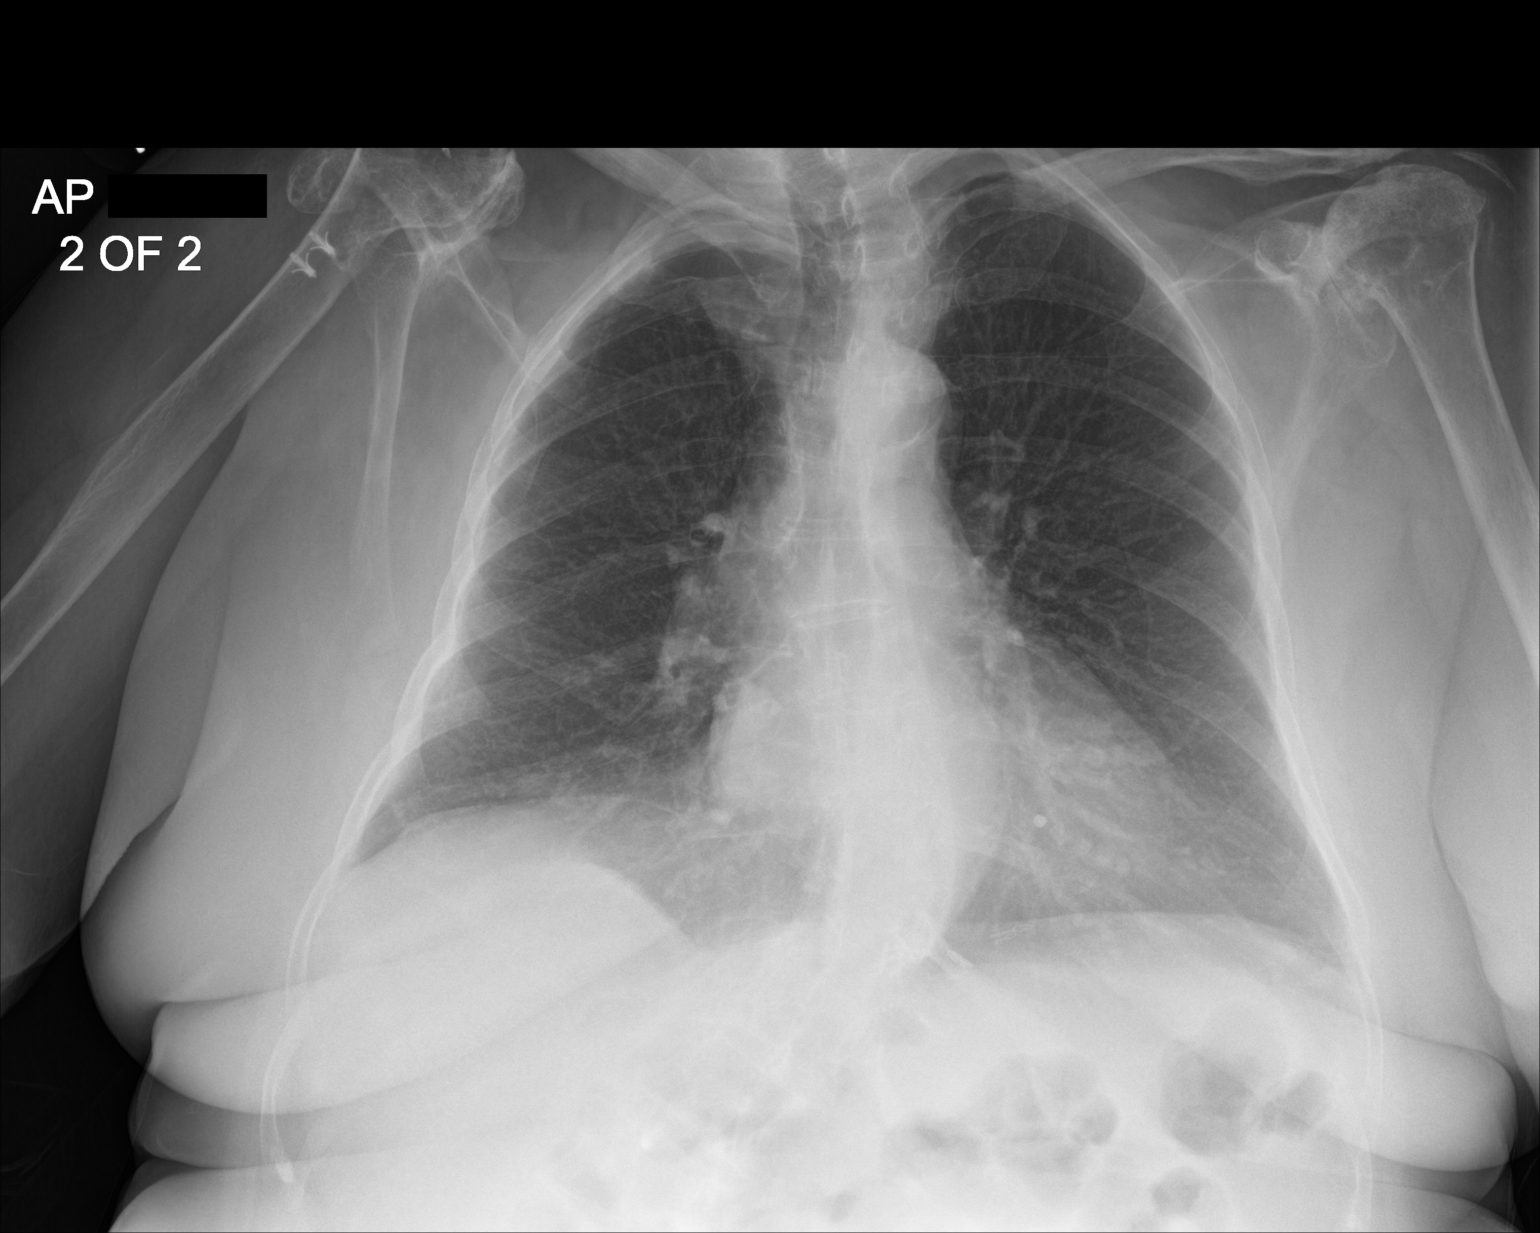

[chest lat]
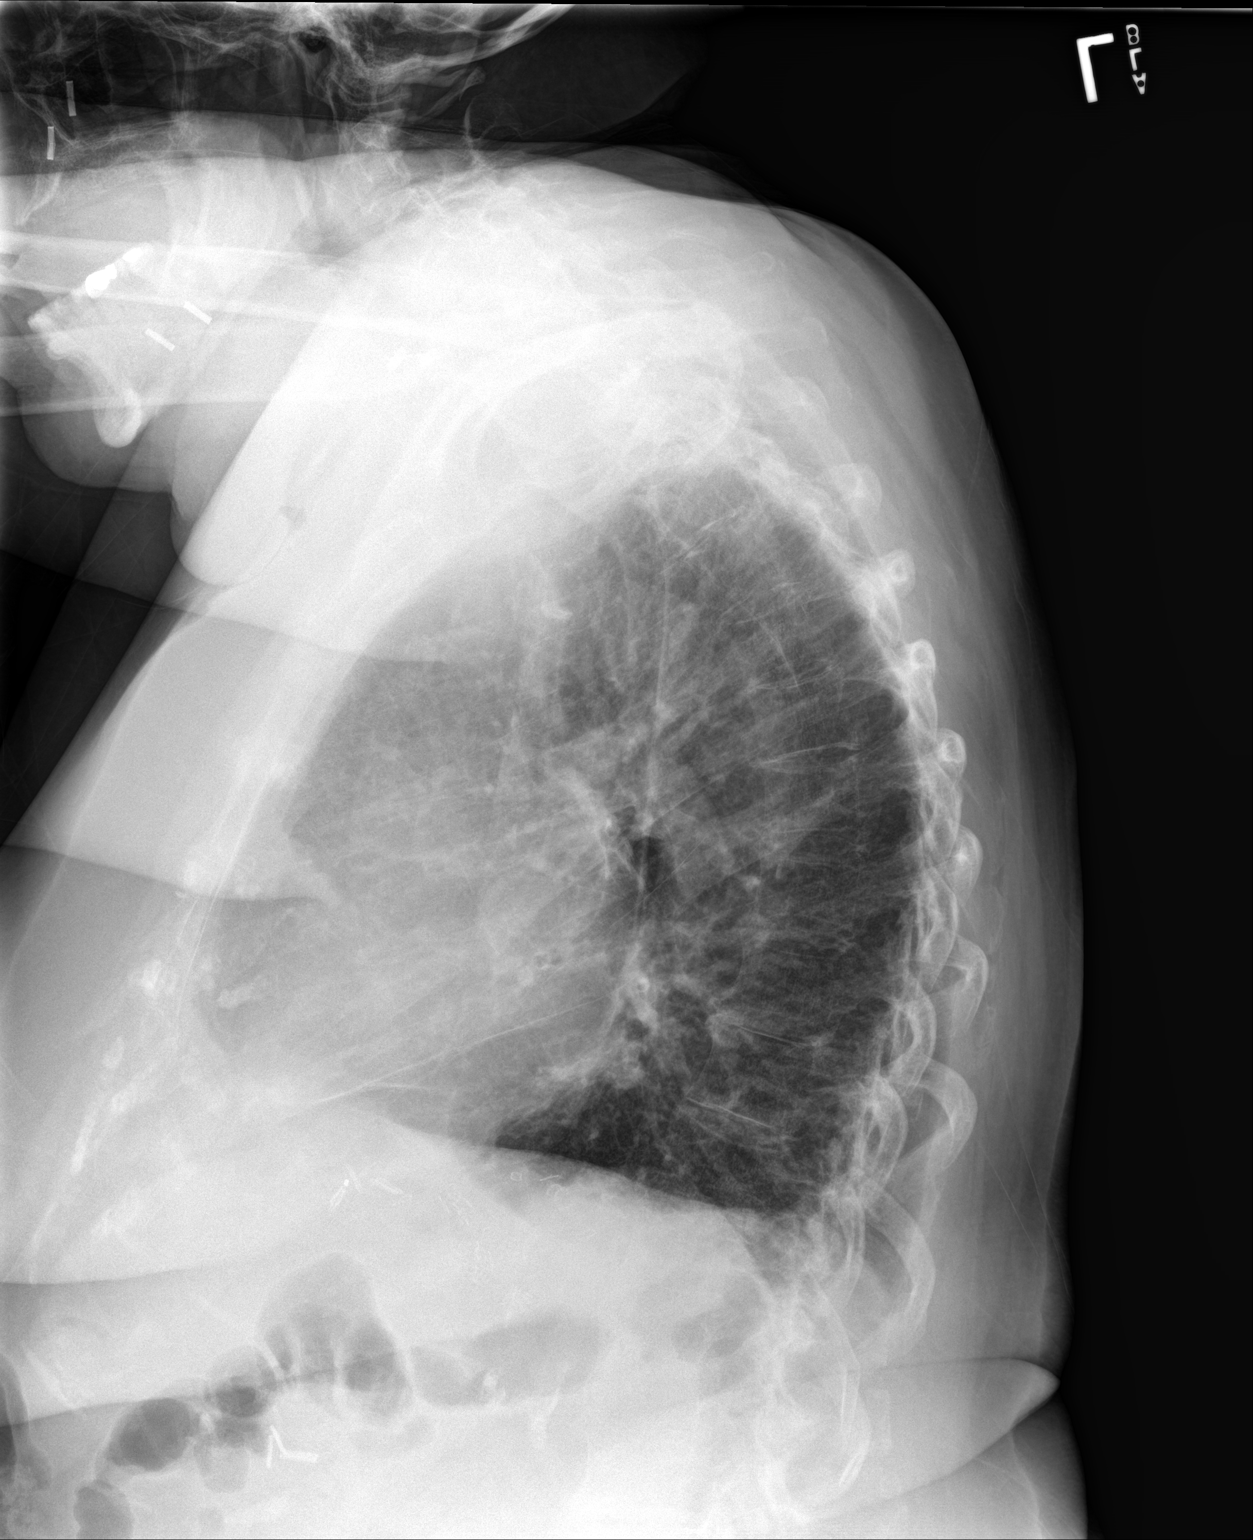

[3 of 3 positions shown; findings below may reference images not displayed]

FINDINGS: The cardiac silhouette, mediastinal and hilar contours are within
normal limits. There is mild tortuosity and calcification of the
thoracic aorta. Minimal streaky basilar scarring changes but no
infiltrates, edema or effusions. No worrisome pulmonary lesions. The
bony structures are intact. There are surgical changes involving the
right shoulder with loose bone anchors in the humerus noted.
IMPRESSION: Streaky bibasilar atelectasis but no infiltrates, edema or
effusions.

## 2019-10-12 DIAGNOSIS — K219 Gastro-esophageal reflux disease without esophagitis: Secondary | ICD-10-CM | POA: Insufficient documentation

## 2020-05-20 DIAGNOSIS — E785 Hyperlipidemia, unspecified: Secondary | ICD-10-CM | POA: Insufficient documentation

## 2022-01-14 ENCOUNTER — Other Ambulatory Visit: Payer: Self-pay

## 2022-01-14 ENCOUNTER — Emergency Department
Admission: EM | Admit: 2022-01-14 | Discharge: 2022-01-14 | Disposition: A | Discharge status: Home / Self Care | Payer: Medicare HMO | Attending: Emergency Medicine | Admitting: Emergency Medicine

## 2022-01-14 ENCOUNTER — Encounter: Payer: Self-pay | Admitting: Emergency Medicine

## 2022-01-14 DIAGNOSIS — S81812A Laceration without foreign body, left lower leg, initial encounter: Secondary | ICD-10-CM | POA: Insufficient documentation

## 2022-01-14 DIAGNOSIS — S8992XA Unspecified injury of left lower leg, initial encounter: Secondary | ICD-10-CM | POA: Diagnosis present

## 2022-01-14 DIAGNOSIS — I10 Essential (primary) hypertension: Secondary | ICD-10-CM | POA: Diagnosis not present

## 2022-01-14 DIAGNOSIS — W230XXA Caught, crushed, jammed, or pinched between moving objects, initial encounter: Secondary | ICD-10-CM | POA: Insufficient documentation

## 2022-01-14 MED ORDER — LIDOCAINE-EPINEPHRINE 2 %-1:100000 IJ SOLN
20.0000 mL | Freq: Once | INTRAMUSCULAR | Status: AC
  Administered 2022-01-14: 20 mL via INTRADERMAL
  Filled 2022-01-14: qty 1

## 2022-01-14 MED ORDER — TRAMADOL HCL 50 MG PO TABS
50.0000 mg | ORAL_TABLET | Freq: Once | ORAL | Status: AC
  Administered 2022-01-14: 50 mg via ORAL
  Filled 2022-01-14: qty 1

## 2022-01-14 MED ORDER — TRAMADOL HCL 50 MG PO TABS: 50.0000 mg | ORAL_TABLET | Freq: Four times a day (QID) | ORAL | 0 refills | Status: DC | PRN

## 2022-01-14 MED ORDER — ONDANSETRON 4 MG PO TBDP
4.0000 mg | ORAL_TABLET | Freq: Once | ORAL | Status: AC
  Administered 2022-01-14: 4 mg via ORAL
  Filled 2022-01-14: qty 1

## 2022-01-14 NOTE — Discharge Instructions (Signed)
Do not get the sutured area wet for 24 hours. After 24 hours, shower/bathe as usual and pat the area dry. °Change the bandage 2 times per day and apply antibiotic ointment. °Leave open to air when at no risk of getting the area dirty, but cover at night before bed. °See your PCP or go to Urgent Care in 10 days for suture removal or sooner for signs or concern of infection. ° °

## 2022-01-14 NOTE — ED Triage Notes (Signed)
Pt via POV from home. Pt hit her shin on the corner of the car door today. Pt has a lac to her L shin approx 6 in. Bleeding controlled. Pt is A&OX4 and NAD. Denies blood thinner ? ?

## 2022-01-14 NOTE — ED Provider Notes (Signed)
? ?[facility] ?Emergency Department Provider Note ?____________________________________________ ? ? Event Date/Time  ? First MD Initiated Contact with Patient 01/14/22 1519   ?  (approximate) ? ?I have reviewed the triage vital signs and the nursing notes. ? ? ?HISTORY ? ?Chief Complaint ?Laceration ? ?HPI ?Julia Sherman is a 76 y.o. female with history as listed below presents to the emergency department for treatment and evaluation of laceration to the left lower leg.  She cut it on the corner of the car door.  She not currently on any blood thinners.  Bleeding well controlled.  Tdap is current. ? ?Past Medical History:  ?Diagnosis Date  ? Hypertension   ? ? ?Allergies ?Novocain [procaine] ?____________________________________________ ? ? ?PHYSICAL EXAM: ? ?VITAL SIGNS: ?ED Triage Vitals  ?Enc Vitals Group  ?   BP 01/14/22 1503 (!) 148/74  ?   Pulse Rate 01/14/22 1503 77  ?   Resp 01/14/22 1503 20  ?   Temp 01/14/22 1503 97.7 ?F (36.5 ?C)  ?   Temp Source 01/14/22 1503 Oral  ?   SpO2 01/14/22 1503 93 %  ?   Weight 01/14/22 1502 165 lb (74.8 kg)  ?   Height 01/14/22 1502 4\' 10"  (1.473 m)  ?   Head Circumference --   ?   Peak Flow --   ?   Pain Score 01/14/22 1502 5  ?   Pain Loc --   ?   Pain Edu? --   ?   Excl. in [place]? --   ? ? ?Constitutional: Alert and oriented. Well appearing and in no acute distress. ?Eyes: Conjunctivae are normal.  ?Head: Atraumatic. ?Nose: No congestion/rhinnorhea. ?Mouth/Throat: Mucous membranes are moist.  Oropharynx non-erythematous. ?Neck: No stridor.   ?Hematological/Lymphatic/Immunilogical: No cervical lymphadenopathy. ?Cardiovascular: Normal rate, regular rhythm. Grossly normal heart sounds.  Good peripheral circulation. ?Respiratory: Normal respiratory effort.  No retractions. Lungs CTAB. ?Gastrointestinal: Soft and nontender. No distention. No abdominal bruits. No CVA tenderness. ?Genitourinary:  ?Musculoskeletal: No lower extremity tenderness nor edema.  No  joint effusions. ?Neurologic:  Normal speech and language. No gross focal neurologic deficits are appreciated. No gait instability. ?Skin:  Skin is warm, dry and intact. No rash noted. ?Psychiatric: Mood and affect are normal. Speech and behavior are normal. ? ?____________________________________________ ?  ?LABS ?(all labs ordered are listed, but only abnormal results are displayed) ? ?Labs Reviewed - No data to display ?____________________________________________ ? ?EKG ? ?Not indicated ?____________________________________________ ? ?RADIOLOGY ? ?ED MD interpretation:   ? ?Not indicated ?I, Sherrie George, personally viewed and evaluated these images (plain radiographs) as part of my medical decision making, as well as reviewing the written report by the radiologist. ?____________________________________________ ? ? ?PROCEDURES ? ?Procedure(s) performed (including Critical Care): ? ?Marland Kitchen.Laceration Repair ? ?Date/Time: 01/14/2022 4:37 PM ?Performed by: Victorino Dike, FNP ?Authorized by: Victorino Dike, FNP  ? ?Laceration details:  ?  Wound length (cm): 6x4x5. ?Treatment:  ?  Amount of cleaning:  Standard ?  Layers/structures repaired:  Deep subcutaneous ?Deep subcutaneous:  ?  Suture size:  4-0 ?  Suture material:  Monocryl ?  Suture technique:  Figure eight ?  Number of sutures:  8 ?Skin repair:  ?  Repair method:  Sutures ?  Suture size:  4-0 ?  Suture material:  Nylon ?  Suture technique:  Simple interrupted ?  Number of sutures:  23 ?Approximation:  ?  Approximation:  Close ?Repair type:  ?  Repair type:  Complex ?Post-procedure details:  ?  Dressing:  Bulky dressing, non-adherent dressing and sterile dressing ?  Procedure completion:  Tolerated well, no immediate complications ? ?____________________________________________ ? ? ?INITIAL IMPRESSION / ASSESSMENT AND PLAN  ? ?  ?76 year old female presenting to the emergency department for treatment and evaluation after she caught her leg on the corner of the  car door.  See HPI for further details. ? ?DIFFERENTIAL DIAGNOSIS ? ?Laceration, laceration with muscle or tendon injury ? ?ED COURSE ? ?Wound was repaired and cleaned as described above.  Patient tolerated procedure well. ? ?Wound care was discussed with the patient and family.  Sterile dressing applied while here and she is to leave that dressing in place until tomorrow evening.  She will follow-up with primary care in 10 days for suture removal or earlier for symptoms of concern. ? ?  ? ?As part of my medical decision making, I reviewed the following data within the [place] ? ?___________________________________________ ? ? ?FINAL CLINICAL IMPRESSION(S) / ED DIAGNOSES ? ?Final diagnoses:  ?Laceration of calf, left, initial encounter  ? ? ? ?ED Discharge Orders   ? ?      Ordered  ?  traMADol (ULTRAM) 50 MG tablet  Every 6 hours PRN       ? 01/14/22 1637  ? ?  ?  ? ?  ? ? ?Note:  This document was prepared using Dragon voice recognition software and may include unintentional dictation errors. ? ?  ?Victorino Dike, FNP ?01/14/22 2048 ? ?  ?Harvest Dark, MD ?01/14/22 2317 ? ?

## 2022-02-14 ENCOUNTER — Other Ambulatory Visit: Payer: Self-pay

## 2022-02-14 ENCOUNTER — Emergency Department
Admission: EM | Admit: 2022-02-14 | Discharge: 2022-02-14 | Disposition: A | Discharge status: Home / Self Care | Payer: Medicare HMO | Attending: Emergency Medicine | Admitting: Emergency Medicine

## 2022-02-14 DIAGNOSIS — I110 Hypertensive heart disease with heart failure: Secondary | ICD-10-CM | POA: Insufficient documentation

## 2022-02-14 DIAGNOSIS — I509 Heart failure, unspecified: Secondary | ICD-10-CM | POA: Insufficient documentation

## 2022-02-14 DIAGNOSIS — L03116 Cellulitis of left lower limb: Secondary | ICD-10-CM | POA: Insufficient documentation

## 2022-02-14 DIAGNOSIS — R2242 Localized swelling, mass and lump, left lower limb: Secondary | ICD-10-CM | POA: Diagnosis present

## 2022-02-14 LAB — COMPREHENSIVE METABOLIC PANEL
ALT: 18 U/L (ref 0–44)
AST: 28 U/L (ref 15–41)
Albumin: 4 g/dL (ref 3.5–5.0)
Alkaline Phosphatase: 70 U/L (ref 38–126)
Anion gap: 11 (ref 5–15)
BUN: 21 mg/dL (ref 8–23)
CO2: 21 mmol/L — ABNORMAL LOW (ref 22–32)
Calcium: 9.3 mg/dL (ref 8.9–10.3)
Chloride: 104 mmol/L (ref 98–111)
Creatinine, Ser: 0.91 mg/dL (ref 0.44–1.00)
GFR, Estimated: >60 mL/min (ref >60)
Glucose, Bld: 160 mg/dL — ABNORMAL HIGH (ref 70–99)
Potassium: 3.7 mmol/L (ref 3.5–5.1)
Sodium: 136 mmol/L (ref 135–145)
Total Bilirubin: 0.4 mg/dL (ref 0.3–1.2)
Total Protein: 7.7 g/dL (ref 6.5–8.1)

## 2022-02-14 LAB — CBC WITH DIFFERENTIAL/PLATELET
Abs Immature Granulocytes: 0.02 10*3/uL (ref 0.00–0.07)
Basophils Absolute: 0 10*3/uL (ref 0.0–0.1)
Basophils Relative: 0 %
Eosinophils Absolute: 0 10*3/uL (ref 0.0–0.5)
Eosinophils Relative: 0 %
HCT: 38.8 % (ref 36.0–46.0)
Hemoglobin: 12.4 g/dL (ref 12.0–15.0)
Immature Granulocytes: 0 %
Lymphocytes Relative: 20 %
Lymphs Abs: 1.5 10*3/uL (ref 0.7–4.0)
MCH: 30 pg (ref 26.0–34.0)
MCHC: 32 g/dL (ref 30.0–36.0)
MCV: 93.7 fL (ref 80.0–100.0)
Monocytes Absolute: 0.7 10*3/uL (ref 0.1–1.0)
Monocytes Relative: 10 %
Neutro Abs: 5.1 10*3/uL (ref 1.7–7.7)
Neutrophils Relative %: 70 %
Platelets: 214 10*3/uL (ref 150–400)
RBC: 4.14 MIL/uL (ref 3.87–5.11)
RDW: 13.2 % (ref 11.5–15.5)
WBC: 7.3 10*3/uL (ref 4.0–10.5)
nRBC: 0 % (ref 0.0–0.2)

## 2022-02-14 LAB — LACTIC ACID, PLASMA: Lactic Acid, Venous: 2 mmol/L (ref 0.5–1.9)

## 2022-02-14 MED ORDER — SULFAMETHOXAZOLE-TRIMETHOPRIM 800-160 MG PO TABS: 1.0000 | ORAL_TABLET | Freq: Two times a day (BID) | ORAL | 0 refills | Status: AC

## 2022-02-14 MED ORDER — CEPHALEXIN 500 MG PO CAPS
500.0000 mg | ORAL_CAPSULE | Freq: Once | ORAL | Status: AC
  Administered 2022-02-14: 500 mg via ORAL
  Filled 2022-02-14: qty 1

## 2022-02-14 MED ORDER — SULFAMETHOXAZOLE-TRIMETHOPRIM 800-160 MG PO TABS
1.0000 | ORAL_TABLET | Freq: Once | ORAL | Status: AC
  Administered 2022-02-14: 1 via ORAL
  Filled 2022-02-14: qty 1

## 2022-02-14 MED ORDER — CEPHALEXIN 500 MG PO CAPS: 500.0000 mg | ORAL_CAPSULE | Freq: Three times a day (TID) | ORAL | 0 refills | Status: AC

## 2022-02-14 NOTE — ED Triage Notes (Signed)
Pt via POV from home. Pt went to her PCP to get her sutures removed. They told her it was infected. Pt does have swelling and redness and black eschar noted to wound. Pt ambulatory to triage. Denies severe pain but states it feels tight. Pt is A&OX4 and NAD ?

## 2022-02-14 NOTE — ED Notes (Signed)
Pt discharge information reviewed. Pt understands need for follow up care and when to return if symptoms worsen. All questions answered. Pt is alert and oriented with even and regular respirations. Pt is seen ambulating out of department with string steady gait.   

## 2022-02-14 NOTE — ED Notes (Signed)
ED Provider at bedside. 

## 2022-02-14 NOTE — ED Provider Notes (Signed)
? ?[facility] ?Provider Note ? ? ? Event Date/Time  ? First MD Initiated Contact with Patient 02/14/22 2134   ?  (approximate) ? ? ?History  ? ?Leg Swelling ? ? ?HPI ? ?Decker Miggins is a 76 y.o. female with a past history of hypertension, osteoporosis, CHF who was sent to the ED by PCP for evaluation of infected wound on left leg.  Patient sustained a laceration to the left lower leg on April 3.  She was seen in the emergency department, had wound care and repair at that time.  She followed up with PCP today, 1 month later for suture removal and they noted that the wound was was surrounded by erythema and appeared to have an eschar and they were worried about soft tissue infection and sent the patient to the ED for evaluation. ? ?Patient reports that she feels fine.  No fever or chills, no chest pain or shortness of breath.  She does have some tightness and soreness in the left lower leg, but is ambulatory, eating normally, normal appetite and energy level. ?  ? ? ?Physical Exam  ? ?Triage Vital Signs: ?ED Triage Vitals  ?Enc Vitals Group  ?   BP 02/14/22 1458 (!) 155/86  ?   Pulse Rate 02/14/22 1458 82  ?   Resp 02/14/22 1458 18  ?   Temp 02/14/22 1458 98.6 ?F (37 ?C)  ?   Temp src --   ?   SpO2 02/14/22 1458 97 %  ?   Weight 02/14/22 1457 160 lb (72.6 kg)  ?   Height 02/14/22 1457 4\' 10"  (1.473 m)  ?   Head Circumference --   ?   Peak Flow --   ?   Pain Score 02/14/22 1457 1  ?   Pain Loc --   ?   Pain Edu? --   ?   Excl. in [place]? --   ? ? ?Most recent vital signs: ?Vitals:  ? 02/14/22 1844 02/14/22 2052  ?BP: (!) 160/88 (!) 153/76  ?Pulse: 78 81  ?Resp: 16 18  ?Temp:    ?SpO2: 98% 96%  ? ? ? ?General: Awake, no distress.  ?CV:  Good peripheral perfusion.  Normal capillary refill and dorsalis pedis pulses. ?Resp:  Normal effort.  ?Abd:  No distention.  ?Other:  Left lower extremity with mild swelling and erythema extending outward from existing wound.  The erythematous area is warm and  tender.  The wound is covered with scab.  There is no crepitus or fluctuance.  No purulent drainage. ? ? ? ? ? ? ? ? ? ?ED Results / Procedures / Treatments  ? ?Labs ?(all labs ordered are listed, but only abnormal results are displayed) ?Labs Reviewed  ?LACTIC ACID, PLASMA - Abnormal; Notable for the following components:  ?    Result Value  ? Lactic Acid, Venous 2.0 (*)   ? All other components within normal limits  ?COMPREHENSIVE METABOLIC PANEL - Abnormal; Notable for the following components:  ? CO2 21 (*)   ? Glucose, Bld 160 (*)   ? All other components within normal limits  ?CBC WITH DIFFERENTIAL/PLATELET  ?LACTIC ACID, PLASMA  ?URINALYSIS, ROUTINE W REFLEX MICROSCOPIC  ? ? ? ?EKG ? ? ? ? ?RADIOLOGY ? ? ? ? ?PROCEDURES: ? ?Critical Care performed: No ? ?Debridement ? ?Date/Time: 02/14/2022 10:47 PM ?Performed by: Sharman Cheek, MD ?Authorized by: Sharman Cheek, MD  ?Consent: Verbal consent obtained. ?Consent given by: patient ?Patient identity  confirmed: verbally with patient ?Local anesthesia used: no ? ?Anesthesia: ?Local anesthesia used: no ? ?Sedation: ?Patient sedated: no ? ?Patient tolerance: patient tolerated the procedure well with no immediate complications ? ? ? ? ?MEDICATIONS ORDERED IN ED: ?Medications  ?cephALEXin (KEFLEX) capsule 500 mg (has no administration in time range)  ?sulfamethoxazole-trimethoprim (BACTRIM DS) 800-160 MG per tablet 1 tablet (has no administration in time range)  ? ? ? ?IMPRESSION / MDM / ASSESSMENT AND PLAN / ED COURSE  ?I reviewed the triage vital signs and the nursing notes. ?             ?               ? ?Patient presents with clinically apparent cellulitis extending from existing wound.  This was debrided at the bedside.  I removed all nylon sutures, and gently removed all of the scab/adherent devitalized superficial tissue from the wound bed.  This revealed an area of approximately 6 x 4 cm with exposed subcutaneous fat.  No visible muscle or tendon tissue.   Compartments are soft.  No evidence of abscess, osteomyelitis, necrotizing fasciitis. ? ?After debridement, the wound was irrigated and cleansed with iodine.  Wound was dressed with Xeroform and nonadherent gauze with 4 x 4 and Kerlix wrap on top.  Patient and family instructed on wound care and follow-up plan and return precautions.  She is not septic and not requiring admission.  She is suitable for oral antibiotics and outpatient management. ? ?Initial lactate was sent as part of a triage protocol, but patient has normal vital signs, trending lactate is not indicated. ? ?  ? ? ?FINAL CLINICAL IMPRESSION(S) / ED DIAGNOSES  ? ?Final diagnoses:  ?Cellulitis of left lower extremity  ? ? ? ?Rx / DC Orders  ? ?ED Discharge Orders   ? ?      Ordered  ?  cephALEXin (KEFLEX) 500 MG capsule  3 times daily       ? 02/14/22 2233  ?  sulfamethoxazole-trimethoprim (BACTRIM DS) 800-160 MG tablet  2 times daily       ? 02/14/22 2233  ? ?  ?  ? ?  ? ? ? ?Note:  This document was prepared using Dragon voice recognition software and may include unintentional dictation errors. ?  ?Carrie Mew, MD ?02/14/22 2251 ? ?

## 2022-11-08 DIAGNOSIS — M4800 Spinal stenosis, site unspecified: Secondary | ICD-10-CM | POA: Insufficient documentation

## 2023-04-02 ENCOUNTER — Ambulatory Visit (INDEPENDENT_AMBULATORY_CARE_PROVIDER_SITE_OTHER): Payer: Medicare HMO | Admitting: Nurse Practitioner

## 2023-04-02 ENCOUNTER — Encounter (INDEPENDENT_AMBULATORY_CARE_PROVIDER_SITE_OTHER): Payer: Self-pay | Admitting: Nurse Practitioner

## 2023-04-02 VITALS — BP 152/78 | HR 84 | Resp 16

## 2023-04-02 DIAGNOSIS — I872 Venous insufficiency (chronic) (peripheral): Secondary | ICD-10-CM | POA: Diagnosis not present

## 2023-04-02 DIAGNOSIS — M1711 Unilateral primary osteoarthritis, right knee: Secondary | ICD-10-CM

## 2023-04-03 ENCOUNTER — Encounter (INDEPENDENT_AMBULATORY_CARE_PROVIDER_SITE_OTHER): Payer: Self-pay | Admitting: Nurse Practitioner

## 2023-04-03 DIAGNOSIS — M5136 Other intervertebral disc degeneration, lumbar region: Secondary | ICD-10-CM | POA: Insufficient documentation

## 2023-04-03 DIAGNOSIS — M1990 Unspecified osteoarthritis, unspecified site: Secondary | ICD-10-CM | POA: Insufficient documentation

## 2023-04-03 DIAGNOSIS — M797 Fibromyalgia: Secondary | ICD-10-CM | POA: Insufficient documentation

## 2023-04-03 NOTE — Progress Notes (Signed)
Subjective:    Patient ID: Julia Sherman, female    DOB: [birth year], 77 y.o.   MRN: [id number] Chief Complaint  Patient presents with   New Patient (Initial Visit)    Ref Audelia Acton consult ble venous stasis    Julia Sherman is a 77 year old female who presents today as a referral from Dr. Audelia Acton for evaluation of venous disease.  The patient has significant arthritis in her right knee.  She is planning on undergoing surgery soon however the patient has some significant venous stasis dermatitis in the right lower extremity and there was concern that she may have some significant underlying venous disease.  The patient reports that she has previously had intervention to her bilateral great saphenous veins (based on her description of the events).  She notes that following this intervention her swelling and pain and discomfort in her legs greatly improved.  She denies that she has any significant edema currently.  She has some evidence of some old scarring but no active wounds or infections in the right lower extremity.  Denies rest pain like symptoms.  She denies claudication however due to her significant knee issues she is not able to walk very far so that that may not enable her to elicit any sort of claudication-like symptoms.    Review of Systems  Cardiovascular:  Negative for leg swelling.  Musculoskeletal:  Positive for arthralgias and gait problem.  All other systems reviewed and are negative.      Objective:   Physical Exam Vitals reviewed.  HENT:     Head: Normocephalic.  Cardiovascular:     Rate and Rhythm: Normal rate.     Pulses:          Dorsalis pedis pulses are 1+ on the right side.       Posterior tibial pulses are 0 on the right side.  Pulmonary:     Effort: Pulmonary effort is normal.  Skin:    General: Skin is warm and dry.     Comments: Stasis dermatitis bilaterally  Neurological:     Mental Status: She is alert and oriented to person, place, and time.   Psychiatric:        Mood and Affect: Mood normal.        Behavior: Behavior normal.        Thought Content: Thought content normal.        Judgment: Judgment normal.     BP (!) 152/78 (BP Location: Left Arm)   Pulse 84   Resp 16   Past Medical History:  Diagnosis Date   Hypertension     Social History   Socioeconomic History   Marital status: Married    Spouse name: Not on file   Number of children: Not on file   Years of education: Not on file   Highest education level: Not on file  Occupational History   Not on file  Tobacco Use   Smoking status: Former    Packs/day: 1    Types: Cigarettes   Smokeless tobacco: Never  Vaping Use   Vaping Use: Never used  Substance and Sexual Activity   Alcohol use: No    Comment: occasionally   Drug use: Not Currently   Sexual activity: Not on file  Other Topics Concern   Not on file  Social History Narrative   Not on file   Social Determinants of Health   Financial Resource Strain: Not on file  Food Insecurity: Not on file  Transportation Needs:  Not on file  Physical Activity: Not on file  Stress: Not on file  Social Connections: Not on file  Intimate Partner Violence: Not on file    History reviewed. No pertinent surgical history.  Family History  Problem Relation Age of Onset   Alzheimer's disease Mother     Allergies  Allergen Reactions   Codeine Itching   Novocain [Procaine] Palpitations   Peanut Butter Flavor Nausea And Vomiting       Latest Ref Rng & Units 02/14/2022    2:55 PM 03/30/2019    5:11 PM  CBC  WBC 4.0 - 10.5 K/uL 7.3  6.4   Hemoglobin 12.0 - 15.0 g/dL 29.5  28.4   Hematocrit 36.0 - 46.0 % 38.8  41.9   Platelets 150 - 400 K/uL 214  153       CMP     Component Value Date/Time   NA 136 02/14/2022 1455   K 3.7 02/14/2022 1455   CL 104 02/14/2022 1455   CO2 21 (L) 02/14/2022 1455   GLUCOSE 160 (H) 02/14/2022 1455   BUN 21 02/14/2022 1455   CREATININE 0.91 02/14/2022 1455    CALCIUM 9.3 02/14/2022 1455   PROT 7.7 02/14/2022 1455   ALBUMIN 4.0 02/14/2022 1455   AST 28 02/14/2022 1455   ALT 18 02/14/2022 1455   ALKPHOS 70 02/14/2022 1455   BILITOT 0.4 02/14/2022 1455   GFRNONAA >60 02/14/2022 1455     No results found.     Assessment & Plan:   1. Venous stasis dermatitis The patient has evidence of venous stasis dermatitis on her right lower extremity more so than the left.  Currently there is no evidence of cellulitis and per the patient's admission she denies any significant edema.  Will have the patient return for reflexes of the right leg as well as ABIs given slightly diminished pulses, to ensure that she is optimized prior to any surgery.  2. Primary osteoarthritis of right knee Venous disease is very stable.  Suspect that she is medically optimized but final determination will be made post ultrasound.  Current Outpatient Medications on File Prior to Visit  Medication Sig Dispense Refill   acetaminophen (TYLENOL) 650 MG CR tablet Take 650 mg by mouth every 8 (eight) hours as needed for pain.     atorvastatin (LIPITOR) 80 MG tablet Take 80 mg by mouth daily.     calcium-vitamin D (OSCAL WITH D) 500-200 MG-UNIT tablet Take 1 tablet by mouth daily with breakfast.     cyclobenzaprine (FLEXERIL) 10 MG tablet Take 10 mg by mouth 3 (three) times daily as needed for muscle spasms.     EPINEPHrine 0.3 mg/0.3 mL IJ SOAJ injection Inject 0.3 mg into the muscle as needed for anaphylaxis.     ferrous gluconate (FERGON) 324 MG tablet Take 324 mg by mouth daily with breakfast.     furosemide (LASIX) 20 MG tablet Take 20 mg by mouth.     melatonin 3 MG TABS tablet Take 3 mg by mouth at bedtime as needed.     omeprazole (PRILOSEC) 20 MG capsule Take 20 mg by mouth daily.     potassium chloride (K-DUR,KLOR-CON) 10 MEQ tablet Take 10 mEq by mouth 2 (two) times daily.     senna-docusate (SENOKOT-S) 8.6-50 MG tablet Take 1 tablet by mouth daily as needed for mild  constipation.     sertraline (ZOLOFT) 50 MG tablet Take 50 mg by mouth daily.     triamcinolone cream (KENALOG)  0.1 % Apply 1 Application topically.     venlafaxine (EFFEXOR) 37.5 MG tablet Take 37.5 mg by mouth daily.     alendronate (FOSAMAX) 70 MG tablet Take 70 mg by mouth once a week. Take with a full glass of water on an empty stomach. (Patient not taking: Reported on 04/02/2023)     ALPRAZolam (XANAX) 0.25 MG tablet Take 0.25 mg by mouth at bedtime. (Patient not taking: Reported on 04/02/2023)     doxycycline (VIBRAMYCIN) 100 MG capsule Take 1 capsule (100 mg total) by mouth 2 (two) times daily. (Patient not taking: Reported on 04/02/2023) 20 capsule 0   escitalopram (LEXAPRO) 10 MG tablet Take 10 mg by mouth daily. (Patient not taking: Reported on 04/02/2023)     furosemide (LASIX) 40 MG tablet Take 1 tablet (40 mg total) by mouth daily for 5 days. Then restart previous lasix prescription. 5 tablet 0   gabapentin (NEURONTIN) 100 MG capsule Take 100 mg by mouth 3 (three) times daily. (Patient not taking: Reported on 04/02/2023)     guaiFENesin-codeine 100-10 MG/5ML syrup Take 5 mLs by mouth 3 (three) times daily as needed for cough. (Patient not taking: Reported on 04/02/2023) 75 mL 0   hydrochlorothiazide (HYDRODIURIL) 25 MG tablet Take 25 mg by mouth daily. (Patient not taking: Reported on 04/02/2023)     predniSONE (DELTASONE) 20 MG tablet Take 2 tablets (40 mg total) by mouth daily. (Patient not taking: Reported on 04/02/2023) 10 tablet 0   sodium chloride (MURO 128) 5 % ophthalmic solution Place 1 drop into both eyes.     traMADol (ULTRAM) 50 MG tablet Take 1 tablet (50 mg total) by mouth every 6 (six) hours as needed. (Patient not taking: Reported on 04/02/2023) 12 tablet 0   vitamin C (ASCORBIC ACID) 500 MG tablet Take 500 mg by mouth daily. (Patient not taking: Reported on 04/02/2023)     No current facility-administered medications on file prior to visit.    There are no Patient  Instructions on file for this visit. No follow-ups on file.   Georgiana Spinner, NP

## 2023-04-08 ENCOUNTER — Ambulatory Visit (INDEPENDENT_AMBULATORY_CARE_PROVIDER_SITE_OTHER): Payer: Medicare HMO

## 2023-04-08 DIAGNOSIS — I872 Venous insufficiency (chronic) (peripheral): Secondary | ICD-10-CM

## 2023-04-09 ENCOUNTER — Encounter (INDEPENDENT_AMBULATORY_CARE_PROVIDER_SITE_OTHER): Payer: Self-pay | Admitting: Nurse Practitioner

## 2023-04-09 ENCOUNTER — Ambulatory Visit (INDEPENDENT_AMBULATORY_CARE_PROVIDER_SITE_OTHER): Payer: Medicare HMO | Admitting: Nurse Practitioner

## 2023-04-09 VITALS — BP 134/84 | Resp 18 | Ht <= 58 in

## 2023-04-09 DIAGNOSIS — I872 Venous insufficiency (chronic) (peripheral): Secondary | ICD-10-CM | POA: Diagnosis not present

## 2023-04-09 DIAGNOSIS — M1711 Unilateral primary osteoarthritis, right knee: Secondary | ICD-10-CM

## 2023-04-09 LAB — VAS US ABI WITH/WO TBI
Left ABI: 1.31
Right ABI: 1.11

## 2023-04-09 NOTE — Progress Notes (Signed)
Subjective:    Patient ID: Julia Sherman, female    DOB: 10/23/1945, 77 y.o.   MRN: [id number] Chief Complaint  Patient presents with   Venous Insufficiency    Alaysia Lightle is a 77 year old female who presents today as a referral from Dr. Audelia Acton for evaluation of venous disease.  The patient has significant arthritis in her right knee.  She is planning on undergoing surgery soon however the patient has some significant venous stasis dermatitis in the right lower extremity and there was concern that she may have some significant underlying venous disease.  The patient reports that she has previously had intervention to her bilateral great saphenous veins (based on her description of the events).  She notes that following this intervention her swelling and pain and discomfort in her legs greatly improved.  She denies that she has any significant edema currently.  She has some evidence of some old scarring but no active wounds or infections in the right lower extremity.  Denies rest pain like symptoms.  She denies claudication however due to her significant knee issues she is not able to walk very far so that that may not enable her to elicit any sort of claudication-like symptoms.   Today noninvasive studies of her right lower extremity showed no evidence of DVT or superficial thrombophlebitis.  No evidence of deep venous insufficiency or superficial venous reflux noted.  The patient has an ABI of 1.11 on the right and 1.31 on the left.  The TBI's are normal bilaterally.  She has strong waveforms throughout both lower extremities with good toe waveforms bilaterally.     Review of Systems  Cardiovascular:  Negative for leg swelling.  Musculoskeletal:  Positive for arthralgias and gait problem.  All other systems reviewed and are negative.      Objective:   Physical Exam Vitals reviewed.  HENT:     Head: Normocephalic.  Cardiovascular:     Rate and Rhythm: Normal rate.     Pulses:           Dorsalis pedis pulses are detected w/ Doppler on the right side and detected w/ Doppler on the left side.       Posterior tibial pulses are detected w/ Doppler on the right side and detected w/ Doppler on the left side.  Pulmonary:     Effort: Pulmonary effort is normal.  Skin:    General: Skin is warm and dry.     Comments: Stasis dermatitis bilaterally  Neurological:     Mental Status: She is alert and oriented to person, place, and time.  Psychiatric:        Mood and Affect: Mood normal.        Behavior: Behavior normal.        Thought Content: Thought content normal.        Judgment: Judgment normal.     BP 134/84 (BP Location: Right Arm)   Resp 18   Ht 4\' 10"  (1.473 m)   BMI 33.44 kg/m   Past Medical History:  Diagnosis Date   Hypertension     Social History   Socioeconomic History   Marital status: Married    Spouse name: Not on file   Number of children: Not on file   Years of education: Not on file   Highest education level: Not on file  Occupational History   Not on file  Tobacco Use   Smoking status: Former    Packs/day: 1    Types: Cigarettes  Smokeless tobacco: Never  Vaping Use   Vaping Use: Never used  Substance and Sexual Activity   Alcohol use: No    Comment: occasionally   Drug use: Not Currently   Sexual activity: Not on file  Other Topics Concern   Not on file  Social History Narrative   Not on file   Social Determinants of Health   Financial Resource Strain: Not on file  Food Insecurity: Not on file  Transportation Needs: Not on file  Physical Activity: Not on file  Stress: Not on file  Social Connections: Not on file  Intimate Partner Violence: Not on file    Past Surgical History:  Procedure Laterality Date   CESAREAN SECTION  1981   CHOLECYSTECTOMY  2010   HERNIA REPAIR     3 different times   HIATAL HERNIA REPAIR  2017   JOINT REPLACEMENT Left 2015   hip   ROTATOR CUFF REPAIR Bilateral 1999   multiple repairs  from the 1980's -2000    Family History  Problem Relation Age of Onset   Alzheimer's disease Mother     Allergies  Allergen Reactions   Citrus    Codeine Itching   Enoxaparin Nausea And Vomiting   Novocain [Procaine] Palpitations   Peanut Butter Flavor Nausea And Vomiting       Latest Ref Rng & Units 02/14/2022    2:55 PM 03/30/2019    5:11 PM  CBC  WBC 4.0 - 10.5 K/uL 7.3  6.4   Hemoglobin 12.0 - 15.0 g/dL 16.1  09.6   Hematocrit 36.0 - 46.0 % 38.8  41.9   Platelets 150 - 400 K/uL 214  153       CMP     Component Value Date/Time   NA 136 02/14/2022 1455   K 3.7 02/14/2022 1455   CL 104 02/14/2022 1455   CO2 21 (L) 02/14/2022 1455   GLUCOSE 160 (H) 02/14/2022 1455   BUN 21 02/14/2022 1455   CREATININE 0.91 02/14/2022 1455   CALCIUM 9.3 02/14/2022 1455   PROT 7.7 02/14/2022 1455   ALBUMIN 4.0 02/14/2022 1455   AST 28 02/14/2022 1455   ALT 18 02/14/2022 1455   ALKPHOS 70 02/14/2022 1455   BILITOT 0.4 02/14/2022 1455   GFRNONAA >60 02/14/2022 1455     No results found.     Assessment & Plan:   1. Venous stasis dermatitis The patient has evidence of venous stasis dermatitis on her right lower extremity more so than the left.  Currently there is no evidence of cellulitis and per the patient's admission she denies any significant edema.   We discussed conservative therapies including use of medical grade compression stockings to be worn daily, elevation of her lower extremities and activity as tolerable.  Right activity is not to be easily done with her knee but physical therapy following knee replacement will be helpful.  Based on her noninvasive studies today currently there is no role for intervention.  2. Primary osteoarthritis of right knee Based on noninvasive studies today the patient's venous disease is stable and does not require any intervention.  She has normal ABIs which should facilitate proper wound healing.  Based on this the patient is medically  optimized, from a vascular standpoint, to undergo surgery for her right knee.  Current Outpatient Medications on File Prior to Visit  Medication Sig Dispense Refill   acetaminophen (TYLENOL) 650 MG CR tablet Take 650 mg by mouth every 8 (eight) hours as needed for  pain.     atorvastatin (LIPITOR) 80 MG tablet Take 80 mg by mouth daily.     EPINEPHrine 0.3 mg/0.3 mL IJ SOAJ injection Inject 0.3 mg into the muscle as needed for anaphylaxis.     furosemide (LASIX) 20 MG tablet Take 20 mg by mouth.     potassium chloride (K-DUR,KLOR-CON) 10 MEQ tablet Take 10 mEq by mouth 2 (two) times daily.     senna-docusate (SENOKOT-S) 8.6-50 MG tablet Take 1 tablet by mouth daily as needed for mild constipation.     venlafaxine (EFFEXOR) 37.5 MG tablet Take 37.5 mg by mouth daily.     venlafaxine (EFFEXOR) 75 MG tablet Take 75 mg by mouth daily.     furosemide (LASIX) 40 MG tablet Take 1 tablet (40 mg total) by mouth daily for 5 days. Then restart previous lasix prescription. 5 tablet 0   No current facility-administered medications on file prior to visit.    There are no Patient Instructions on file for this visit. No follow-ups on file.   Georgiana Spinner, NP

## 2023-04-26 ENCOUNTER — Other Ambulatory Visit: Payer: Self-pay | Admitting: Orthopedic Surgery

## 2023-05-01 ENCOUNTER — Encounter
Admission: RE | Admit: 2023-05-01 | Discharge: 2023-05-01 | Disposition: A | Discharge status: Home / Self Care | Payer: Medicare HMO | Source: Ambulatory Visit | Attending: Orthopedic Surgery | Admitting: Orthopedic Surgery

## 2023-05-01 ENCOUNTER — Other Ambulatory Visit: Payer: Self-pay

## 2023-05-01 VITALS — BP 161/88 | HR 80 | Temp 97.5°F | Resp 18 | Ht <= 58 in | Wt 166.0 lb

## 2023-05-01 DIAGNOSIS — E782 Mixed hyperlipidemia: Secondary | ICD-10-CM | POA: Diagnosis not present

## 2023-05-01 DIAGNOSIS — M1711 Unilateral primary osteoarthritis, right knee: Secondary | ICD-10-CM | POA: Insufficient documentation

## 2023-05-01 DIAGNOSIS — M797 Fibromyalgia: Secondary | ICD-10-CM | POA: Diagnosis not present

## 2023-05-01 DIAGNOSIS — Z6834 Body mass index (BMI) 34.0-34.9, adult: Secondary | ICD-10-CM | POA: Diagnosis not present

## 2023-05-01 DIAGNOSIS — Z01818 Encounter for other preprocedural examination: Secondary | ICD-10-CM | POA: Diagnosis not present

## 2023-05-01 DIAGNOSIS — Z01810 Encounter for preprocedural cardiovascular examination: Secondary | ICD-10-CM | POA: Diagnosis not present

## 2023-05-01 DIAGNOSIS — Z87891 Personal history of nicotine dependence: Secondary | ICD-10-CM | POA: Insufficient documentation

## 2023-05-01 DIAGNOSIS — I10 Essential (primary) hypertension: Secondary | ICD-10-CM | POA: Insufficient documentation

## 2023-05-01 DIAGNOSIS — E669 Obesity, unspecified: Secondary | ICD-10-CM | POA: Diagnosis not present

## 2023-05-01 HISTORY — DX: Other intervertebral disc degeneration, lumbar region: M51.36

## 2023-05-01 HISTORY — DX: Anxiety disorder, unspecified: F41.9

## 2023-05-01 HISTORY — DX: Other intervertebral disc degeneration, lumbar region without mention of lumbar back pain or lower extremity pain: M51.369

## 2023-05-01 HISTORY — DX: Scoliosis, unspecified: M41.9

## 2023-05-01 HISTORY — DX: Depression, unspecified: F32.A

## 2023-05-01 HISTORY — DX: White matter disease, unspecified: R90.82

## 2023-05-01 HISTORY — DX: Unspecified osteoarthritis, unspecified site: M19.90

## 2023-05-01 HISTORY — DX: Personal history of other specified conditions: Z87.898

## 2023-05-01 HISTORY — DX: Hyperlipidemia, unspecified: E78.5

## 2023-05-01 HISTORY — DX: Fibromyalgia: M79.7

## 2023-05-01 HISTORY — DX: Chronic sinusitis, unspecified: J32.9

## 2023-05-01 HISTORY — DX: Noninfective gastroenteritis and colitis, unspecified: K52.9

## 2023-05-01 HISTORY — DX: Gastro-esophageal reflux disease without esophagitis: K21.9

## 2023-05-01 LAB — CBC WITH DIFFERENTIAL/PLATELET
Abs Immature Granulocytes: 0.03 10*3/uL (ref 0.00–0.07)
Basophils Absolute: 0 10*3/uL (ref 0.0–0.1)
Basophils Relative: 0 %
Eosinophils Absolute: 0 10*3/uL (ref 0.0–0.5)
Eosinophils Relative: 0 %
HCT: 41.9 % (ref 36.0–46.0)
Hemoglobin: 13.7 g/dL (ref 12.0–15.0)
Immature Granulocytes: 0 %
Lymphocytes Relative: 22 %
Lymphs Abs: 1.5 10*3/uL (ref 0.7–4.0)
MCH: 30 pg (ref 26.0–34.0)
MCHC: 32.7 g/dL (ref 30.0–36.0)
MCV: 91.9 fL (ref 80.0–100.0)
Monocytes Absolute: 0.9 10*3/uL (ref 0.1–1.0)
Monocytes Relative: 13 %
Neutro Abs: 4.5 10*3/uL (ref 1.7–7.7)
Neutrophils Relative %: 65 %
Platelets: 227 10*3/uL (ref 150–400)
RBC: 4.56 MIL/uL (ref 3.87–5.11)
RDW: 12.9 % (ref 11.5–15.5)
WBC: 7 10*3/uL (ref 4.0–10.5)
nRBC: 0 % (ref 0.0–0.2)

## 2023-05-01 LAB — SURGICAL PCR SCREEN
MRSA, PCR: NEGATIVE
Staphylococcus aureus: POSITIVE — AB

## 2023-05-01 LAB — COMPREHENSIVE METABOLIC PANEL
ALT: 22 U/L (ref 0–44)
AST: 25 U/L (ref 15–41)
Albumin: 4.5 g/dL (ref 3.5–5.0)
Alkaline Phosphatase: 96 U/L (ref 38–126)
Anion gap: 11 (ref 5–15)
BUN: 28 mg/dL — ABNORMAL HIGH (ref 8–23)
CO2: 20 mmol/L — ABNORMAL LOW (ref 22–32)
Calcium: 9.6 mg/dL (ref 8.9–10.3)
Chloride: 105 mmol/L (ref 98–111)
Creatinine, Ser: 0.76 mg/dL (ref 0.44–1.00)
GFR, Estimated: >60 mL/min (ref >60)
Glucose, Bld: 97 mg/dL (ref 70–99)
Potassium: 3.5 mmol/L (ref 3.5–5.1)
Sodium: 136 mmol/L (ref 135–145)
Total Bilirubin: 0.5 mg/dL (ref 0.3–1.2)
Total Protein: 7.8 g/dL (ref 6.5–8.1)

## 2023-05-01 LAB — URINALYSIS, ROUTINE W REFLEX MICROSCOPIC
Bilirubin Urine: NEGATIVE
Glucose, UA: NEGATIVE mg/dL
Hgb urine dipstick: NEGATIVE
Ketones, ur: NEGATIVE mg/dL
Nitrite: NEGATIVE
Protein, ur: NEGATIVE mg/dL
Specific Gravity, Urine: 1.015 (ref 1.005–1.030)
pH: 5 (ref 5.0–8.0)

## 2023-05-01 LAB — TYPE AND SCREEN
ABO/RH(D): O POS
Antibody Screen: NEGATIVE

## 2023-05-01 NOTE — Patient Instructions (Addendum)
Your procedure is scheduled on: Thursday , July 25 Report to the Registration Desk on the 1st floor of the [facility] To find out your arrival time, please call [phone] between 1PM - 3PM on: Wednesday, July 24 If your arrival time is 6:00 am, do not arrive before that time as the [facility] entrance doors do not open until 6:00 am.  REMEMBER: Instructions that are not followed completely may result in serious medical risk, up to and including death; or upon the discretion of your surgeon and anesthesiologist your surgery may need to be rescheduled.  Do not eat food after midnight the night before surgery.  No gum chewing or hard candies.  You may however, drink CLEAR liquids up to 2 hours before you are scheduled to arrive for your surgery. Do not drink anything within 2 hours of your scheduled arrival time.  Clear liquids include: - water  - apple juice without pulp - gatorade (not RED colors) - black coffee or tea (Do NOT add milk or creamers to the coffee or tea) Do NOT drink anything that is not on this list.   In addition, your doctor has ordered for you to drink the provided:  Ensure Pre-Surgery Clear Carbohydrate Drink  Drinking this carbohydrate drink up to two hours before surgery helps to reduce insulin resistance and improve patient outcomes. Please complete drinking 2 hours before scheduled arrival time.  One week prior to surgery: Stop Anti-inflammatories (NSAIDS) such as Advil, Aleve, Ibuprofen, Motrin, Naproxen, Naprosyn and Aspirin based products such as Excedrin, Goody's Powder, BC Powder. Stop ANY OVER THE COUNTER supplements until after surgery. You may however, continue to take Tylenol if needed for pain up until the day of surgery.  No Alcohol for 24 hours before or after surgery.  No Smoking including e-cigarettes for 24 hours before surgery.  No chewable tobacco products for at least 6 hours before surgery.  No nicotine patches on the day of  surgery.  Do not use any "recreational" drugs for at least a week (preferably 2 weeks) before your surgery.  Please be advised that the combination of cocaine and anesthesia may have negative outcomes, up to and including death. If you test positive for cocaine, your surgery will be cancelled.  On the morning of surgery brush your teeth with toothpaste and water, you may rinse your mouth with mouthwash if you wish. Do not swallow any toothpaste or mouthwash.  Use CHG Soap or wipes as directed on instruction sheet.  Do not wear jewelry, make-up, hairpins, clips or nail polish.  Do not wear lotions, powders, or perfumes.   Do not shave body hair from the neck down 48 hours before surgery.  Contact lenses, hearing aids and dentures may not be worn into surgery.  Do not bring valuables to the hospital. [facility] is not responsible for any missing/lost belongings or valuables.   Notify your doctor if there is any change in your medical condition (cold, fever, infection).  Wear comfortable clothing (specific to your surgery type) to the hospital.  After surgery, you can help prevent lung complications by doing breathing exercises.  Take deep breaths and cough every 1-2 hours. Your doctor may order a device called an Incentive Spirometer to help you take deep breaths.  If you are being admitted to the hospital overnight, leave your suitcase in the car. After surgery it may be brought to your room.  In case of increased patient census, it may be necessary for you, the  patient, to continue your postoperative care in the Same Day Surgery department.  If you are being discharged the day of surgery, you will not be allowed to drive home. You will need a responsible individual to drive you home and stay with you for 24 hours after surgery.   If you are taking public transportation, you will need to have a responsible individual with you.  Please call the Pre-admissions Testing Dept. at  [phone] if you have any questions about these instructions.  Surgery Visitation Policy:  Patients having surgery or a procedure may have two visitors.  Children under the age of 10 must have an adult with them who is not the patient.  Inpatient Visitation:    Visiting hours are 7 a.m. to 8 p.m. Up to four visitors are allowed at one time in a patient room. The visitors may rotate out with other people during the day.  One visitor age 50 or older may stay with the patient overnight and must be in the room by 8 p.m.    Pre-operative 5 CHG Bath Instructions   You can play a key role in reducing the risk of infection after surgery. Your skin needs to be as free of germs as possible. You can reduce the number of germs on your skin by washing with CHG (chlorhexidine gluconate) soap before surgery. CHG is an antiseptic soap that kills germs and continues to kill germs even after washing.   DO NOT use if you have an allergy to chlorhexidine/CHG or antibacterial soaps. If your skin becomes reddened or irritated, stop using the CHG and notify one of our RNs at [phone].   Please shower with the CHG soap starting 4 days before surgery using the following schedule:     Please keep in mind the following:  DO NOT shave, including legs and underarms, starting the day of your first shower.   You may shave your face at any point before/day of surgery.  Place clean sheets on your bed the day you start using CHG soap. Use a clean washcloth (not used since being washed) for each shower. DO NOT sleep with pets once you start using the CHG.   CHG Shower Instructions:  If you choose to wash your hair and private area, wash first with your normal shampoo/soap.  After you use shampoo/soap, rinse your hair and body thoroughly to remove shampoo/soap residue.  Turn the water OFF and apply about 3 tablespoons (45 ml) of CHG soap to a CLEAN washcloth.  Apply CHG soap ONLY FROM YOUR NECK DOWN TO YOUR  TOES (washing for 3-5 minutes)  DO NOT use CHG soap on face, private areas, open wounds, or sores.  Pay special attention to the area where your surgery is being performed.  If you are having back surgery, having someone wash your back for you may be helpful. Wait 2 minutes after CHG soap is applied, then you may rinse off the CHG soap.  Pat dry with a clean towel  Put on clean clothes/pajamas   If you choose to wear lotion, please use ONLY the CHG-compatible lotions on the back of this paper.     Additional instructions for the day of surgery: DO NOT APPLY any lotions, deodorants, cologne, or perfumes.   Put on clean/comfortable clothes.  Brush your teeth.  Ask your nurse before applying any prescription medications to the skin.      CHG Compatible Lotions   Aveeno Moisturizing lotion  Cetaphil Moisturizing Cream  Cetaphil Moisturizing Lotion  Clairol Herbal Essence Moisturizing Lotion, Dry Skin  Clairol Herbal Essence Moisturizing Lotion, Extra Dry Skin  Clairol Herbal Essence Moisturizing Lotion, Normal Skin  Curel Age Defying Therapeutic Moisturizing Lotion with Alpha Hydroxy  Curel Extreme Care Body Lotion  Curel Soothing Hands Moisturizing Hand Lotion  Curel Therapeutic Moisturizing Cream, Fragrance-Free  Curel Therapeutic Moisturizing Lotion, Fragrance-Free  Curel Therapeutic Moisturizing Lotion, Original Formula  Eucerin Daily Replenishing Lotion  Eucerin Dry Skin Therapy Plus Alpha Hydroxy Crme  Eucerin Dry Skin Therapy Plus Alpha Hydroxy Lotion  Eucerin Original Crme  Eucerin Original Lotion  Eucerin Plus Crme Eucerin Plus Lotion  Eucerin TriLipid Replenishing Lotion  Keri Anti-Bacterial Hand Lotion  Keri Deep Conditioning Original Lotion Dry Skin Formula Softly Scented  Keri Deep Conditioning Original Lotion, Fragrance Free Sensitive Skin Formula  Keri Lotion Fast Absorbing Fragrance Free Sensitive Skin Formula  Keri Lotion Fast Absorbing Softly Scented Dry  Skin Formula  Keri Original Lotion  Keri Skin Renewal Lotion Keri Silky Smooth Lotion  Keri Silky Smooth Sensitive Skin Lotion  Nivea Body Creamy Conditioning Oil  Nivea Body Extra Enriched Lotion  Nivea Body Original Lotion  Nivea Body Sheer Moisturizing Lotion Nivea Crme  Nivea Skin Firming Lotion  NutraDerm 30 Skin Lotion  NutraDerm Skin Lotion  NutraDerm Therapeutic Skin Cream  NutraDerm Therapeutic Skin Lotion  ProShield Protective Hand Cream  Provon moisturizing lotion  Preoperative Educational Videos for Total Hip, Knee and Shoulder Replacements  To better prepare for surgery, please view our videos that explain the physical activity and discharge planning required to have the best surgical recovery at [facility].  [email or web address]  Questions? Call [phone] or email [email or web address]

## 2023-05-01 NOTE — Progress Notes (Signed)
  Perioperative Services Pre-Admission/Anesthesia Testing    Date: 05/01/23  Name: Julia Sherman MRN:   [id number]  Re: Abnormal ECG and need for further preoperative evaluation  Planned Surgical Procedure(s):    Case: [id number] Date/Time: 05/09/23 1010   Procedure: TOTAL KNEE ARTHROPLASTY (Right: Knee)   Anesthesia type: Choice   Pre-op diagnosis: Primary osteoarthritis of right knee M17.11   Location: [facility] OR ROOM 01 / [facility]   Surgeons: Reinaldo Berber, MD   Clinical Notes:  Patient is scheduled for the above procedure on 05/09/2023 with Dr. Reinaldo Berber, MD.  In preparation for the procedure, patient presented to the PAT clinic on 05/01/2023 for preoperative interview and testing.  In review of her ECG, patient with diffuse anterolateral T wave abnormalities noted in leads I, aVL, and V2-V5.  No ECG on file with [facility] for comparison.  Patient's last ECG was performed on 01/21/2021 with [facility]; cardiology interpretation normal sinus rhythm at a rate of 87 bpm.  There was no mention of ST or T wave abnormalities.  Unable to view tracing.  Copy of ECG sent to PCP for review and comparison.  Medical clearance requested.  Patient denies any known history of cardiovascular disease, however has several risk factors, including:   Age  Sex HTN HLD Obesity (BMI 34.69 kg/m).    Patient is a former smoker.  She does not consume alcohol or use illegal substances. Patient denied any significant angina/anginal equivalent symptoms in clinic.  She does have occasional episodes of shortness of breath, however this is a chronic symptom and is reported to be stable and at baseline.   ECG:    Impression and Plan:  [name] found to have an abnormal preoperative ECG.  There are no previous tracings available for review.  I have sent a copy of her ECG to her PCP for review and comparison.  Medical clearance has been requested.  Given that patient is undergoing  elective orthopedic surgery, and in the absence of tracing for comparison, referring patient to cardiology for further evaluation and preoperative clearance.  No changes are being made to the OR schedule at this time.  Patient was advised that surgery may need to be postponed pending evaluation by cardiology and completion of any cardiovascular testing deemed necessary.  Sending referral to [facility] cardiology per patient request today.  Clinic to contact patient with appointment details.  Will follow notes in Care Everywhere for appointment details and ultimate disposition from cardiology.  Sending note to patient's primary attending surgeon Audelia Acton, MD) to make him aware of noted ECG changes and plans for evaluation with cardiology prior to surgery.  Given patient's rapidly approaching surgery date, will see if surgeon can reach out to cardiology in efforts to expedite consult for clearance  Quentin Mulling, MSN, APRN, FNP-C, CEN [facility]  Peri-operative Services Nurse Practitioner Phone: [phone] 05/01/23 4:31 PM  NOTE: This note has been prepared using Dragon dictation software. Despite my best ability to proofread, there is always the potential that unintentional transcriptional errors may still occur from this process.

## 2023-05-03 ENCOUNTER — Encounter: Payer: Self-pay | Admitting: Orthopedic Surgery

## 2023-05-09 ENCOUNTER — Other Ambulatory Visit: Payer: Self-pay

## 2023-05-09 ENCOUNTER — Ambulatory Visit: Payer: Medicare HMO | Admitting: Urgent Care

## 2023-05-09 ENCOUNTER — Encounter
Admission: RE | Disposition: A | Discharge status: Home Health | Payer: Self-pay | Source: Home / Self Care | Attending: Orthopedic Surgery

## 2023-05-09 ENCOUNTER — Observation Stay
Admission: RE | Admit: 2023-05-09 | Discharge: 2023-05-10 | Disposition: A | Discharge status: Home Health | Payer: Medicare HMO | Source: Home / Self Care | Attending: Orthopedic Surgery | Admitting: Orthopedic Surgery

## 2023-05-09 ENCOUNTER — Ambulatory Visit: Payer: Medicare HMO

## 2023-05-09 DIAGNOSIS — M1711 Unilateral primary osteoarthritis, right knee: Secondary | ICD-10-CM | POA: Diagnosis present

## 2023-05-09 DIAGNOSIS — I10 Essential (primary) hypertension: Secondary | ICD-10-CM | POA: Diagnosis not present

## 2023-05-09 DIAGNOSIS — Z96651 Presence of right artificial knee joint: Secondary | ICD-10-CM

## 2023-05-09 DIAGNOSIS — Z96642 Presence of left artificial hip joint: Secondary | ICD-10-CM | POA: Diagnosis not present

## 2023-05-09 DIAGNOSIS — Z79899 Other long term (current) drug therapy: Secondary | ICD-10-CM | POA: Diagnosis not present

## 2023-05-09 DIAGNOSIS — Z85828 Personal history of other malignant neoplasm of skin: Secondary | ICD-10-CM | POA: Insufficient documentation

## 2023-05-09 HISTORY — PX: TOTAL KNEE ARTHROPLASTY: SHX125

## 2023-05-09 HISTORY — DX: Unspecified malignant neoplasm of skin, unspecified: C44.90

## 2023-05-09 LAB — ABO/RH: ABO/RH(D): O POS

## 2023-05-09 SURGERY — ARTHROPLASTY, KNEE, TOTAL
Anesthesia: Spinal | Site: Knee | Laterality: Right

## 2023-05-09 MED ORDER — KETOROLAC TROMETHAMINE 30 MG/ML IJ SOLN
INTRAMUSCULAR | Status: AC
  Filled 2023-05-09: qty 1

## 2023-05-09 MED ORDER — FENTANYL CITRATE (PF) 100 MCG/2ML IJ SOLN: 25.0000 ug | INTRAMUSCULAR | Status: DC | PRN

## 2023-05-09 MED ORDER — LACTATED RINGERS IV SOLN: INTRAVENOUS | Status: DC

## 2023-05-09 MED ORDER — VENLAFAXINE HCL ER 75 MG PO CP24
75.0000 mg | ORAL_CAPSULE | Freq: Every day | ORAL | Status: DC
  Administered 2023-05-09: 75 mg via ORAL
  Filled 2023-05-09: qty 1

## 2023-05-09 MED ORDER — ONDANSETRON HCL 4 MG/2ML IJ SOLN: 4.0000 mg | Freq: Once | INTRAMUSCULAR | Status: DC | PRN

## 2023-05-09 MED ORDER — PHENYLEPHRINE HCL-NACL 20-0.9 MG/250ML-% IV SOLN
INTRAVENOUS | Status: AC
  Filled 2023-05-09: qty 250

## 2023-05-09 MED ORDER — FAMOTIDINE 20 MG PO TABS
ORAL_TABLET | ORAL | Status: AC
  Filled 2023-05-09: qty 1

## 2023-05-09 MED ORDER — METOCLOPRAMIDE HCL 5 MG PO TABS: 5.0000 mg | ORAL_TABLET | Freq: Three times a day (TID) | ORAL | Status: DC | PRN

## 2023-05-09 MED ORDER — PHENOL 1.4 % MT LIQD: 1.0000 | OROMUCOSAL | Status: DC | PRN

## 2023-05-09 MED ORDER — PANTOPRAZOLE SODIUM 40 MG PO TBEC
40.0000 mg | DELAYED_RELEASE_TABLET | Freq: Every day | ORAL | Status: DC
  Administered 2023-05-09 – 2023-05-10 (×2): 40 mg via ORAL
  Filled 2023-05-09 (×3): qty 1

## 2023-05-09 MED ORDER — OXYCODONE HCL 5 MG/5ML PO SOLN: 5.0000 mg | Freq: Once | ORAL | Status: AC | PRN

## 2023-05-09 MED ORDER — TRANEXAMIC ACID-NACL 1000-0.7 MG/100ML-% IV SOLN
INTRAVENOUS | Status: AC
  Filled 2023-05-09: qty 100

## 2023-05-09 MED ORDER — FAMOTIDINE 20 MG PO TABS
20.0000 mg | ORAL_TABLET | Freq: Once | ORAL | Status: AC
  Administered 2023-05-09: 20 mg via ORAL

## 2023-05-09 MED ORDER — MIDAZOLAM HCL 5 MG/5ML IJ SOLN
INTRAMUSCULAR | Status: DC | PRN
  Administered 2023-05-09 (×2): 1 mg via INTRAVENOUS

## 2023-05-09 MED ORDER — ATORVASTATIN CALCIUM 20 MG PO TABS
80.0000 mg | ORAL_TABLET | Freq: Every day | ORAL | Status: DC
  Administered 2023-05-09: 80 mg via ORAL
  Filled 2023-05-09: qty 4

## 2023-05-09 MED ORDER — DEXMEDETOMIDINE HCL IN NACL 80 MCG/20ML IV SOLN
INTRAVENOUS | Status: DC | PRN
  Administered 2023-05-09: 12 ug via INTRAVENOUS

## 2023-05-09 MED ORDER — ACETAMINOPHEN 10 MG/ML IV SOLN
INTRAVENOUS | Status: DC | PRN
  Administered 2023-05-09: 1000 mg via INTRAVENOUS

## 2023-05-09 MED ORDER — MENTHOL 3 MG MT LOZG: 1.0000 | LOZENGE | OROMUCOSAL | Status: DC | PRN

## 2023-05-09 MED ORDER — PHENYLEPHRINE HCL (PRESSORS) 10 MG/ML IV SOLN
INTRAVENOUS | Status: DC | PRN
  Administered 2023-05-09: 160 ug via INTRAVENOUS

## 2023-05-09 MED ORDER — TRANEXAMIC ACID 1000 MG/10ML IV SOLN
INTRAVENOUS | Status: AC
  Filled 2023-05-09: qty 10

## 2023-05-09 MED ORDER — ONDANSETRON HCL 4 MG/2ML IJ SOLN: 4.0000 mg | Freq: Four times a day (QID) | INTRAMUSCULAR | Status: DC | PRN

## 2023-05-09 MED ORDER — HYDROCODONE-ACETAMINOPHEN 5-325 MG PO TABS
1.0000 | ORAL_TABLET | ORAL | Status: DC | PRN
  Administered 2023-05-09 – 2023-05-10 (×2): 1 via ORAL
  Filled 2023-05-09: qty 1
  Filled 2023-05-09: qty 2

## 2023-05-09 MED ORDER — SODIUM CHLORIDE 0.9 % IR SOLN
Status: DC | PRN
  Administered 2023-05-09: 3000 mL

## 2023-05-09 MED ORDER — CHLORHEXIDINE GLUCONATE 0.12 % MT SOLN
15.0000 mL | Freq: Once | OROMUCOSAL | Status: AC
  Administered 2023-05-09: 15 mL via OROMUCOSAL

## 2023-05-09 MED ORDER — TRAMADOL HCL 50 MG PO TABS: 50.0000 mg | ORAL_TABLET | Freq: Four times a day (QID) | ORAL | Status: DC | PRN

## 2023-05-09 MED ORDER — DEXAMETHASONE SODIUM PHOSPHATE 10 MG/ML IJ SOLN
INTRAMUSCULAR | Status: AC
  Filled 2023-05-09: qty 1

## 2023-05-09 MED ORDER — APIXABAN 2.5 MG PO TABS
2.5000 mg | ORAL_TABLET | Freq: Two times a day (BID) | ORAL | Status: DC
  Administered 2023-05-10: 2.5 mg via ORAL
  Filled 2023-05-09: qty 1

## 2023-05-09 MED ORDER — ACETAMINOPHEN 10 MG/ML IV SOLN: 1000.0000 mg | Freq: Once | INTRAVENOUS | Status: DC | PRN

## 2023-05-09 MED ORDER — CEFAZOLIN SODIUM-DEXTROSE 2-4 GM/100ML-% IV SOLN
INTRAVENOUS | Status: AC
  Filled 2023-05-09: qty 100

## 2023-05-09 MED ORDER — OXYCODONE HCL 5 MG PO TABS
5.0000 mg | ORAL_TABLET | Freq: Once | ORAL | Status: AC | PRN
  Administered 2023-05-09: 5 mg via ORAL

## 2023-05-09 MED ORDER — DEXMEDETOMIDINE HCL IN NACL 80 MCG/20ML IV SOLN
INTRAVENOUS | Status: AC
  Filled 2023-05-09: qty 20

## 2023-05-09 MED ORDER — PROPOFOL 1000 MG/100ML IV EMUL
INTRAVENOUS | Status: AC
  Filled 2023-05-09: qty 100

## 2023-05-09 MED ORDER — KETOROLAC TROMETHAMINE 15 MG/ML IJ SOLN
INTRAMUSCULAR | Status: AC
  Filled 2023-05-09: qty 1

## 2023-05-09 MED ORDER — CEFAZOLIN SODIUM-DEXTROSE 2-4 GM/100ML-% IV SOLN
2.0000 g | INTRAVENOUS | Status: AC
  Administered 2023-05-09: 2 g via INTRAVENOUS

## 2023-05-09 MED ORDER — CEFAZOLIN SODIUM-DEXTROSE 2-4 GM/100ML-% IV SOLN
2.0000 g | Freq: Four times a day (QID) | INTRAVENOUS | Status: AC
  Administered 2023-05-09 (×2): 2 g via INTRAVENOUS
  Filled 2023-05-09 (×4): qty 100

## 2023-05-09 MED ORDER — SODIUM CHLORIDE 0.9 % IV SOLN: INTRAVENOUS | Status: DC

## 2023-05-09 MED ORDER — SURGIPHOR WOUND IRRIGATION SYSTEM - OPTIME: TOPICAL | Status: DC | PRN

## 2023-05-09 MED ORDER — CHLORHEXIDINE GLUCONATE 0.12 % MT SOLN
OROMUCOSAL | Status: AC
  Filled 2023-05-09: qty 15

## 2023-05-09 MED ORDER — SODIUM CHLORIDE (PF) 0.9 % IJ SOLN
INTRAMUSCULAR | Status: DC | PRN
  Administered 2023-05-09: 71 mL via INTRAMUSCULAR

## 2023-05-09 MED ORDER — BUPIVACAINE HCL (PF) 0.5 % IJ SOLN
INTRAMUSCULAR | Status: DC | PRN
  Administered 2023-05-09: 2.4 mL via INTRATHECAL

## 2023-05-09 MED ORDER — OXYCODONE HCL 5 MG PO TABS
ORAL_TABLET | ORAL | Status: AC
  Filled 2023-05-09: qty 1

## 2023-05-09 MED ORDER — ACETAMINOPHEN 10 MG/ML IV SOLN
INTRAVENOUS | Status: AC
  Filled 2023-05-09: qty 100

## 2023-05-09 MED ORDER — MIDAZOLAM HCL 2 MG/2ML IJ SOLN
INTRAMUSCULAR | Status: AC
  Filled 2023-05-09: qty 2

## 2023-05-09 MED ORDER — LIDOCAINE HCL (PF) 2 % IJ SOLN
INTRAMUSCULAR | Status: AC
  Filled 2023-05-09: qty 5

## 2023-05-09 MED ORDER — TRANEXAMIC ACID-NACL 1000-0.7 MG/100ML-% IV SOLN
1000.0000 mg | INTRAVENOUS | Status: AC
  Administered 2023-05-09 (×2): 1000 mg via INTRAVENOUS

## 2023-05-09 MED ORDER — ACETAMINOPHEN 500 MG PO TABS
1000.0000 mg | ORAL_TABLET | Freq: Three times a day (TID) | ORAL | Status: DC
  Administered 2023-05-09 – 2023-05-10 (×2): 1000 mg via ORAL
  Filled 2023-05-09 (×2): qty 2

## 2023-05-09 MED ORDER — ONDANSETRON HCL 4 MG PO TABS: 4.0000 mg | ORAL_TABLET | Freq: Four times a day (QID) | ORAL | Status: DC | PRN

## 2023-05-09 MED ORDER — EPHEDRINE SULFATE (PRESSORS) 50 MG/ML IJ SOLN
INTRAMUSCULAR | Status: DC | PRN
  Administered 2023-05-09: 10 mg via INTRAVENOUS
  Administered 2023-05-09: 5 mg via INTRAVENOUS

## 2023-05-09 MED ORDER — DEXAMETHASONE SODIUM PHOSPHATE 10 MG/ML IJ SOLN
8.0000 mg | Freq: Once | INTRAMUSCULAR | Status: AC
  Administered 2023-05-09: 8 mg via INTRAVENOUS

## 2023-05-09 MED ORDER — METOCLOPRAMIDE HCL 5 MG/ML IJ SOLN: 5.0000 mg | Freq: Three times a day (TID) | INTRAMUSCULAR | Status: DC | PRN

## 2023-05-09 MED ORDER — DOCUSATE SODIUM 100 MG PO CAPS
100.0000 mg | ORAL_CAPSULE | Freq: Two times a day (BID) | ORAL | Status: DC
  Administered 2023-05-09 – 2023-05-10 (×2): 100 mg via ORAL
  Filled 2023-05-09 (×2): qty 1

## 2023-05-09 MED ORDER — PHENYLEPHRINE HCL-NACL 20-0.9 MG/250ML-% IV SOLN
INTRAVENOUS | Status: DC | PRN
  Administered 2023-05-09: 25 ug/min via INTRAVENOUS

## 2023-05-09 MED ORDER — ORAL CARE MOUTH RINSE: 15.0000 mL | Freq: Once | OROMUCOSAL | Status: AC

## 2023-05-09 MED ORDER — KETOROLAC TROMETHAMINE 15 MG/ML IJ SOLN
7.5000 mg | Freq: Four times a day (QID) | INTRAMUSCULAR | Status: AC
  Administered 2023-05-09 – 2023-05-10 (×4): 7.5 mg via INTRAVENOUS
  Filled 2023-05-09 (×3): qty 1

## 2023-05-09 MED ORDER — MORPHINE SULFATE (PF) 2 MG/ML IV SOLN: 0.5000 mg | INTRAVENOUS | Status: DC | PRN

## 2023-05-09 MED ORDER — BUPIVACAINE HCL (PF) 0.5 % IJ SOLN
INTRAMUSCULAR | Status: AC
  Filled 2023-05-09: qty 20

## 2023-05-09 MED ORDER — FUROSEMIDE 20 MG PO TABS
30.0000 mg | ORAL_TABLET | Freq: Every day | ORAL | Status: DC
  Administered 2023-05-09: 30 mg via ORAL
  Filled 2023-05-09: qty 2

## 2023-05-09 MED ORDER — PROPOFOL 500 MG/50ML IV EMUL
INTRAVENOUS | Status: DC | PRN
  Administered 2023-05-09: 100 ug/kg/min via INTRAVENOUS

## 2023-05-09 SURGICAL SUPPLY — 78 items
ADH SKN CLS APL DERMABOND .7 (GAUZE/BANDAGES/DRESSINGS) ×1
APL PRP STRL LF DISP 70% ISPRP (MISCELLANEOUS) ×2
BLADE REAMER PATELLA SZ29 (BLADE) IMPLANT
BLADE SAGITTAL AGGR TOOTH XLG (BLADE) IMPLANT
BLADE SAW SAG 25X90X1.19 (BLADE) ×1 IMPLANT
BLADE SAW SAG 29X58X.64 (BLADE) ×1 IMPLANT
BONE CEMENT HUM AFFIXUS (Cement) ×2 IMPLANT
BOWL CEMENT MIX W/ADAPTER (MISCELLANEOUS) ×1 IMPLANT
BSPLAT TIB 5D C 16NT STM RT (Knees) ×1 IMPLANT
CEMENT BONE HUM AFFIXUS (Cement) IMPLANT
CHLORAPREP W/TINT 26 (MISCELLANEOUS) ×2 IMPLANT
COMP FEM CMT PS STD 4 RT (Joint) ×1 IMPLANT
COMPONENT FEM CMT PS STD 4 RT (Joint) IMPLANT
COOLER POLAR GLACIER W/PUMP (MISCELLANEOUS) ×1 IMPLANT
CUFF TOURN SGL QUICK 24 (TOURNIQUET CUFF)
CUFF TOURN SGL QUICK 30 (TOURNIQUET CUFF)
CUFF TRNQT CYL 24X4X16.5-23 (TOURNIQUET CUFF) IMPLANT
CUFF TRNQT CYL 30X4X21-28X (TOURNIQUET CUFF) IMPLANT
DERMABOND ADVANCED .7 DNX12 (GAUZE/BANDAGES/DRESSINGS) ×1 IMPLANT
DRAPE 3/4 80X56 (DRAPES) ×1 IMPLANT
DRAPE INCISE IOBAN 66X60 STRL (DRAPES) IMPLANT
DRSG MEPILEX SACRM 8.7X9.8 (GAUZE/BANDAGES/DRESSINGS) ×1 IMPLANT
DRSG OPSITE POSTOP 4X10 (GAUZE/BANDAGES/DRESSINGS) IMPLANT
DRSG OPSITE POSTOP 4X8 (GAUZE/BANDAGES/DRESSINGS) IMPLANT
ELECT REM PT RETURN 9FT ADLT (ELECTROSURGICAL) ×1
ELECTRODE REM PT RTRN 9FT ADLT (ELECTROSURGICAL) ×1 IMPLANT
GLOVE BIO SURGEON STRL SZ8 (GLOVE) ×1 IMPLANT
GLOVE BIOGEL PI IND STRL 8 (GLOVE) ×1 IMPLANT
GLOVE PI ORTHO PRO STRL 7.5 (GLOVE) ×2 IMPLANT
GLOVE PI ORTHO PRO STRL SZ8 (GLOVE) ×2 IMPLANT
GOWN SRG XL LVL 3 NONREINFORCE (GOWNS) ×1 IMPLANT
GOWN STRL NON-REIN TWL XL LVL3 (GOWNS) ×1
GOWN STRL REUS W/ TWL LRG LVL3 (GOWN DISPOSABLE) ×1 IMPLANT
GOWN STRL REUS W/ TWL XL LVL3 (GOWN DISPOSABLE) ×1 IMPLANT
GOWN STRL REUS W/TWL LRG LVL3 (GOWN DISPOSABLE) ×1
GOWN STRL REUS W/TWL XL LVL3 (GOWN DISPOSABLE) ×1
HANDLE YANKAUER SUCT OPEN TIP (MISCELLANEOUS) ×1 IMPLANT
HOOD PEEL AWAY T7 (MISCELLANEOUS) ×2 IMPLANT
IMPL ASF RT PSN 4-5/CD 10 (Joint) IMPLANT
IMPLANT ASF RT PSN 4-5/CD 10 (Joint) ×1 IMPLANT
INSERT PAT PSN TIB 26M 7.5 THK (Miscellaneous) IMPLANT
IV NS IRRIG 3000ML ARTHROMATIC (IV SOLUTION) ×1 IMPLANT
KIT TURNOVER KIT A (KITS) ×1 IMPLANT
MANIFOLD NEPTUNE II (INSTRUMENTS) ×1 IMPLANT
MARKER SKIN DUAL TIP RULER LAB (MISCELLANEOUS) ×1 IMPLANT
MAT ABSORB FLUID 56X50 GRAY (MISCELLANEOUS) ×1 IMPLANT
NDL FILTER BLUNT 18X1 1/2 (NEEDLE) ×1 IMPLANT
NDL HYPO 21X1.5 SAFETY (NEEDLE) ×1 IMPLANT
NDL SAFETY ECLIP 18X1.5 (MISCELLANEOUS) ×1 IMPLANT
NEEDLE FILTER BLUNT 18X1 1/2 (NEEDLE) ×1 IMPLANT
NEEDLE HYPO 21X1.5 SAFETY (NEEDLE) ×1 IMPLANT
PACK TOTAL KNEE (MISCELLANEOUS) ×1 IMPLANT
PAD ARMBOARD 7.5X6 YLW CONV (MISCELLANEOUS) ×3 IMPLANT
PAD WRAPON POLAR KNEE (MISCELLANEOUS) ×1 IMPLANT
PENCIL SMOKE EVACUATOR (MISCELLANEOUS) ×1 IMPLANT
PIN DRILL HDLS TROCAR 75 4PK (PIN) IMPLANT
PULSAVAC PLUS IRRIG FAN TIP (DISPOSABLE) ×1
SCREW FEMALE HEX FIX 25X2.5 (ORTHOPEDIC DISPOSABLE SUPPLIES) IMPLANT
SCREW HEX HEADED 3.5X27 DISP (ORTHOPEDIC DISPOSABLE SUPPLIES) IMPLANT
SLEEVE SCD COMPRESS KNEE MED (STOCKING) ×1 IMPLANT
SOLUTION IRRIG SURGIPHOR (IV SOLUTION) ×1 IMPLANT
STEM TIB ST PERS 14+30 (Stem) IMPLANT
STEM TIBIA 5 DEG SZ C R KNEE (Knees) IMPLANT
SUT DVC 2 QUILL PDO T11 36X36 (SUTURE) ×1 IMPLANT
SUT QUILL MONODERM 3-0 PS-2 (SUTURE) ×1 IMPLANT
SUT VIC AB 0 CT1 36 (SUTURE) ×1 IMPLANT
SUT VIC AB 2-0 CT2 27 (SUTURE) ×2 IMPLANT
SUT VICRYL 1-0 27IN ABS (SUTURE) ×1
SUTURE VICRYL 1-0 27IN ABS (SUTURE) ×1 IMPLANT
SYR 30ML LL (SYRINGE) ×2 IMPLANT
SYR TB 1ML LL NO SAFETY (SYRINGE) ×1 IMPLANT
TAPE CLOTH 3X10 WHT NS LF (GAUZE/BANDAGES/DRESSINGS) ×1 IMPLANT
TIBIA STEM 5 DEG SZ C R KNEE (Knees) ×1 IMPLANT
TIP FAN IRRIG PULSAVAC PLUS (DISPOSABLE) ×1 IMPLANT
TOWEL OR 17X26 4PK STRL BLUE (TOWEL DISPOSABLE) IMPLANT
TRAP FLUID SMOKE EVACUATOR (MISCELLANEOUS) ×1 IMPLANT
WATER STERILE IRR 1000ML POUR (IV SOLUTION) ×1 IMPLANT
WRAPON POLAR PAD KNEE (MISCELLANEOUS) ×1

## 2023-05-09 NOTE — Evaluation (Signed)
Physical Therapy Evaluation Patient Details Name: Julia Sherman MRN: [id number] DOB: 10-13-1946 Today's Date: 05/09/2023  History of Present Illness  Pt 77 y/o female s/p R TKA 7/25.  Clinical Impression  Pt did very well with POD0 metrics showing great quad set, SLRs, AROM >100, independence with bed mobility and ambulated 100 ft with RW.  Pt c/o minimal pain t/o the session and apart from some mild fatigue after ambulation felt great and did very well.  Continue with PT per TKA protocol.       Assistance Recommended at Discharge Intermittent Supervision/Assistance  If plan is discharge home, recommend the following:  Can travel by private vehicle  A little help with walking and/or transfers;A little help with bathing/dressing/bathroom;Assistance with cooking/housework;Assist for transportation   Yes    Equipment Recommendations None recommended by PT  Recommendations for Other Services       Functional Status Assessment Patient has had a recent decline in their functional status and demonstrates the ability to make significant improvements in function in a reasonable and predictable amount of time.     Precautions / Restrictions Precautions Precautions: Fall Restrictions Weight Bearing Restrictions: No      Mobility  Bed Mobility Overal bed mobility: Modified Independent             General bed mobility comments: Pt was slow to get to EOB but did not need assist, minimal cuing    Transfers Overall transfer level: Modified independent Equipment used: Rolling walker (2 wheels)               General transfer comment: Pt was able to rise to standing    Ambulation/Gait Ambulation/Gait assistance: Supervision Gait Distance (Feet): 100 Feet Assistive device: Rolling walker (2 wheels)         General Gait Details: Pt had some initial hesitation with R WBing but with cuing was able to show increased cadence and symmetry.  Pt with some fatigue with the  effort but no increased pain.  SpO2 to 92%, HR 110s.  Stairs            Wheelchair Mobility     Tilt Bed    Modified Rankin (Stroke Patients Only)       Balance Overall balance assessment: Modified Independent                                           Pertinent Vitals/Pain Pain Assessment Pain Assessment: No/denies pain    Home Living Family/patient expects to be discharged to:: Private residence Living Arrangements: Spouse/significant other Available Help at Discharge: Available 24 hours/day   Home Access: Level entry       Home Layout: Laundry or work area in basement Home Equipment: Agricultural consultant (2 wheels);Toilet riser      Prior Function Prior Level of Function : Independent/Modified Independent             Mobility Comments: Pt reports that she is able to drive and get out, has not been using a walker but limping consistently ADLs Comments: Husband does laundry, but she manages dressing, etc     Hand Dominance        Extremity/Trunk Assessment   Upper Extremity Assessment Upper Extremity Assessment: Generalized weakness;Overall [facility] for tasks assessed    Lower Extremity Assessment Lower Extremity Assessment: Generalized weakness;Overall [facility] for tasks assessed (expected post-op weakness, but able to do SLRs,  good quad engagement)       Communication   Communication: No difficulties  Cognition Arousal/Alertness: Awake/alert Behavior During Therapy: WFL for tasks assessed/performed Overall Cognitive Status: Within Functional Limits for tasks assessed                                          General Comments      Exercises Total Joint Exercises Ankle Circles/Pumps: AROM Quad Sets: 10 reps, Strengthening Short Arc Quad: Strengthening, 10 reps Heel Slides: AROM, 10 reps (with resisted leg ext) Hip ABduction/ADduction: Strengthening, 10 reps Straight Leg Raises: AROM, 10 reps Knee Flexion: PROM, 5  reps Goniometric ROM: 0-101   Assessment/Plan    PT Assessment Patient needs continued PT services  PT Problem List Decreased strength;Decreased activity tolerance;Decreased range of motion;Decreased balance;Decreased knowledge of use of DME;Pain;Decreased knowledge of precautions;Decreased safety awareness;Decreased mobility       PT Treatment Interventions DME instruction;Gait training;Functional mobility training;Therapeutic activities;Therapeutic exercise;Balance training;Cognitive remediation;Patient/family education    PT Goals (Current goals can be found in the Care Plan section)  Acute Rehab PT Goals Patient Stated Goal: Go home tomorrow PT Goal Formulation: With patient Time For Goal Achievement: 05/22/23 Potential to Achieve Goals: Good    Frequency BID     Co-evaluation               AM-PAC PT "6 Clicks" Mobility  Outcome Measure Help needed turning from your back to your side while in a flat bed without using bedrails?: None Help needed moving from lying on your back to sitting on the side of a flat bed without using bedrails?: None Help needed moving to and from a bed to a chair (including a wheelchair)?: None Help needed standing up from a chair using your arms (e.g., wheelchair or bedside chair)?: None Help needed to walk in hospital room?: A Little Help needed climbing 3-5 steps with a railing? : A Little 6 Click Score: 22    End of Session Equipment Utilized During Treatment: Gait belt Activity Tolerance: Patient tolerated treatment well Patient left: with call bell/phone within reach;with family/visitor present Nurse Communication: Mobility status PT Visit Diagnosis: Muscle weakness (generalized) (M62.81);Difficulty in walking, not elsewhere classified (R26.2);Pain Pain - Right/Left: Right Pain - part of body: Knee    Time: 1630-1711 PT Time Calculation (min) (ACUTE ONLY): 41 min   Charges:   PT Evaluation $PT Eval Low Complexity: 1 Low PT  Treatments $Gait Training: 8-22 mins $Therapeutic Exercise: 8-22 mins PT General Charges $$ ACUTE PT VISIT: 1 Visit       Malachi Pro, DPT 05/09/2023, 5:42 PM

## 2023-05-09 NOTE — H&P (Signed)
History of Present Illness: The patient is an 77 y.o. female seen in clinic today for follow-up evaluation of her right knee. Patient has now had right knee pain for almost a year with known right knee arthritis. Her pain is continuing to affect her on a daily basis with pain up to an 8 out of 10 at its worst limiting her ability to walk and perform activities of daily living. She is undergone treatment with cortisone injections, home exercises, and weight loss but continues to have severe medial and anterior knee pain with crackling popping and intermittent locking of the knee. Despite treatment with ibuprofen ice and heat she still has severe pain. She does have a history of venous stasis and was evaluated by vascular surgery who did not find any current ongoing flow or active wound issues on her leg. The patient denies fevers, chills, numbness, tingling, shortness of breath, chest pain, recent illness, or any trauma.  The patient has a BMI of 36.9, she is nondiabetic with a last recorded A1c of 5.5, and she does not smoke  Past Medical History: Past Medical History:  Diagnosis Date  Anxiety  Cancer (CMS/HHS-HCC)  skin  Chronic sinusitis  Degenerative disc disease, lumbar  Depression  Fibromyalgia  GERD (gastroesophageal reflux disease)  History of motion sickness  IBD (inflammatory bowel disease)  Osteoarthritis  Other "heavy-for-dates" infants (HHS-HCC)  degenerative disk  Reflux  Scoliosis  White matter disease   Past Surgical History: Past Surgical History:  Procedure Laterality Date  ARTHROPLASTY HIP TOTAL Left 11/12/2013  Procedure: ARTHROPLASTY HIP TOTAL; Surgeon: Bess Kinds, MD; Location: [facility] OR; Service: Orthopedics; Laterality: Left; [place]. Lateral on a peg board  EGD N/A 09/01/2015  Procedure: EGD; Surgeon: Micki Riley, MD; Location: [facility] ENDO/[facility]; Service: Gastroenterology; Laterality: N/A;  REPAIR PARAESOPHAGEAL HIATAL HERNIA  FUNDOPLICATION W/MESH/PROSTHESIS N/A 08/13/2016  Procedure: REPAIR, PARAESOPHAGEAL HIATAL HERNIA (INCLUDING FUNDOPLICATION), VIA LAPAROTOMY, EXCEPT NEONATAL; WITH IMPLANTATION OF MESH OR OTHER PROSTHESIS; Surgeon: Bryon Lions, MD; Location: [facility] OR; Service: General Surgery; Laterality: N/A;  ESOPHAGOGASTRODOUDENOSCOPY W/BIOPSY 02/01/2017  Procedure: ESOPHAGOGASTRODUODENOSCOPY, FLEXIBLE, TRANSORAL; WITH BIOPSY, SINGLE OR MULTIPLE; Surgeon: Saintclair Halsted, MD; Location: [facility] ENDO/[facility]; Service: Gastroenterology;;  Jannifer Rodney W/INSERTION GUIDEWIRE N/A 02/01/2017  Procedure: ESOPHAGOGASTRODUODENOSCOPY, FLEXIBLE, TRANSORAL; WITH INSERTION OF GUIDE WIRE FOLLOWED BY PASSAGE OF DILATOR(S) THROUGH ESOPHAGUS OVER GUIDE WIRE; Surgeon: Saintclair Halsted, MD; Location: [facility] ENDO/BR[facility]; Service: Gastroenterology; Laterality: N/A;  ARTHROSCOPIC ROTATOR CUFF REPAIR Bilateral  multiple surgeries on each shoulder  CESAREAN SECTION  CHOLECYSTECTOMY  COLONOSCOPY  ear surgeries Bilateral  multiple  EYE TRAUMA  scratched unsure which eye, long time ago  EYELID SURGERY Bilateral ~2006  ? BULB  HERNIA REPAIR  x2  HYSTERECTOMY  JOINT REPLACEMENT  TUBAL LIGATION   Past Family History: Family History  Problem Relation Age of Onset  Alzheimer's disease Mother  Glaucoma Neg Hx  Macular degeneration Neg Hx  Blindness Neg Hx   Medications: Current Outpatient Medications  Medication Sig Dispense Refill  acetaminophen (TYLENOL) 650 MG ER tablet Take 1 tablet (650 mg total) by mouth every 8 (eight) hours as needed for Pain. Do not exceed 3000 mg daily 30 tablet 0  atorvastatin (LIPITOR) 80 MG tablet TAKE 1 TABLET (80 MG TOTAL) BY MOUTH DAILY FOR CHOLESTEROL  calcium carbonate-vitamin D3 (CALTRATE 600+D) 600 mg(1,500mg ) -200 unit tablet Take 1 tablet by mouth 2 (two) times daily with meals.  Compound Medication Naltrexone: 1.5 mg 1 cap X 14 days, then 2 caps x 14 days, then 3  caps  thereafter (Patient not taking: Reported on 09/03/2019 ) 90 each 3  EPINEPHrine (EPIPEN) 0.3 mg/0.3 mL auto-injector Inject 0.3 mL (0.3 mg total) into the muscle once as needed for anaphylaxis. May repeat once in 5-15 minutes if symptoms continue.  ferrous gluconate (FERGON) 324 mg (37.5 mg iron) Tab tablet Take 1 tablet (324 mg total) by mouth daily with breakfast. (Patient not taking: Reported on 09/03/2019 ) 30 tablet 1  FUROsemide (LASIX) 20 MG tablet Take 30 mg by mouth. Reported on 09/29/2015  KLOR-CON 10 10 mEq ER tablet Take 10 mEq by mouth once daily. 3  melatonin 3 mg tablet Take 1 tablet (3 mg total) by mouth nightly. (Patient not taking: Reported on 01/28/2018 ) 30 tablet 1  omeprazole (PRILOSEC) 20 MG DR capsule Take 1 capsule (20 mg total) by mouth 2 (two) times daily (Patient not taking: Reported on 09/03/2019 ) 60 capsule 11  sennosides-docusate (SENOKOT-S) 8.6-50 mg tablet Take 1 tablet by mouth once daily as needed. Reported on 09/29/2015  sertraline (ZOLOFT) 25 MG tablet Take 25 mg by mouth once daily  sodium chloride (MURO 128) 5 % ophthalmic solution Place 1 drop into both eyes once daily  triamcinolone 0.1 % cream Reported on 09/29/2015 1  venlafaxine (EFFEXOR-XR) 37.5 MG XR capsule Take 37.5 mg by mouth once daily   No current facility-administered medications for this visit.   Allergies: Allergies  Allergen Reactions  Novocain [Procaine] Palpitations  Peanut Butter Flavor (Bulk) Vomiting  Codeine Itching    Visit Vitals: Vitals:  04/23/23 1019  BP: 136/78    Review of Systems:  A comprehensive 14 point ROS was performed, reviewed, and the pertinent orthopaedic findings are documented in the HPI.  Physical Exam: General/Constitutional: No apparent distress: well-nourished and well developed. Eyes: Icteric Pulmonary exam: Lungs clear to auscultation bilaterally no wheezing rales or rhonchi Cardiac exam: Regular rate and rhythm no obvious murmurs rubs or  gallops. Integumentary: No impressive skin lesions present, except as noted in detailed exam. Neuro/Psych: Normal mood and affect, oriented to person, place and time.  Comprehensive Knee Exam: Gait Antalgic  Alignment Neutral   Inspection Right Left  Skin Normal appearance with no obvious deformity. There is chronic venous stasis changes to the mid calf distal with a now well-healed area over the medial calf no active wounds or drainage noted. Normal appearance with no obvious deformity. There is chronic venous stasis changes to the mid calf distal with flaking dry skin and some erythema around the ankle.  Soft Tissue No focal soft tissue swelling No focal soft tissue swelling  Quad Atrophy None None   Palpation  Right Left  Tenderness Medial joint line and parapatellar tenderness to palpation No peripatellar, patellar tendon, quad tendon, medial/lateral joint line pain  Crepitus + patellofemoral and tibiofemoral crepitus No patellofemoral or tibiofemoral crepitus  Effusion None None   Range of Motion Right Left  Flexion 0-115 0-125  Extension Full knee extension without hyperextension Full knee extension without hyperextension   Ligamentous Exam Right Left  Lachman Normal Normal  Valgus 0 Normal Normal  Valgus 30 Normal Normal  Varus 0 Normal Normal  Varus 30 Normal Normal  Anterior Drawer Normal Normal  Posterior Drawer Normal Normal   Meniscal Exam Right Left  Hyperflexion Test Positive Negative  Hyperextension Test Positive Negative  McMurray's Positive Negative   Neurovascular Right Left  Quadriceps Strength 5/5 5/5  Hamstring Strength 5/5 5/5  Hip Abductor Strength 4/5 4/5  Distal Motor Normal Normal  Distal Sensory Normal light touch sensation Normal light touch sensation  Distal Pulses Normal Normal    Imaging Studies: I have reviewed AP, lateral,sunrise, and flexed PA weight bearing knee X-rays (4 views) of the right knee ordered and taken today in the  office show severe degenerative changes with medial and patellofemoral joint space narrowing and osteophyte formation, subchondral cysts and sclerosis. There is fragmentation of the patella medially with trochlear erosion along the medial side. Kellgren-Lawrence grade 4. AP, sunrise, and flexed PA of the left knee also show mild degenerative changes with medial joint space narrowing proximal tibial sclerosis and patellofemoral sclerosis and joint space narrowing Kellgren-Lawrence grade 2/3. No fractures or dislocations noted in either knee.  Assessment:  Right knee osteoarthritis  Plan: Andreka is a 77 year old female who presents with right knee bone on bone arthritis. Based upon the patient's continued symptoms and failure to respond to conservative treatment, I have recommended a right total knee replacement for this patient. A long discussion took place with the patient describing what a total joint replacement is and what the procedure would entail. A knee model, similar to the implants that will be used during the operation, was utilized to demonstrate the implants. Choices of implant manufactures were discussed and reviewed. The ability to secure the implant utilizing cement or cementless (press fit) fixation was discussed. The approach and exposure was discussed.   The hospitalization and post-operative care and rehabilitation were also discussed. The use of perioperative antibiotics and DVT prophylaxis were discussed. The risk, benefits and alternatives to a surgical intervention were discussed at length with the patient. The patient was also advised of risks related to the medical comorbidities and elevated body mass index (BMI). A lengthy discussion took place to review the most common complications including but not limited to: stiffness, loss of function, complex regional pain syndrome, deep vein thrombosis, pulmonary embolus, heart attack, stroke, infection, wound breakdown, numbness,  intraoperative fracture, damage to nerves, tendon,muscles, arteries or other blood vessels, death and other possible complications from anesthesia. The patient was told that we will take steps to minimize these risks by using sterile technique, antibiotics and DVT prophylaxis when appropriate and follow the patient postoperatively in the office setting to monitor progress. The possibility of recurrent pain, no improvement in pain and actual worsening of pain were also discussed with the patient.   The discharge plan of care focused on the patient going home following surgery. The patient was encouraged to make the necessary arrangements to have someone stay with them when they are discharged home.   The benefits of surgery were discussed with the patient including the potential for improving the patient's current clinical condition through operative intervention. Alternatives to surgical intervention including continued conservative management were also discussed in detail. All questions were answered to the satisfaction of the patient. The patient participated and agreed to the plan of care as well as the use of the recommended implants for their total knee replacement surgery. An information packet was given to the patient to review prior to surgery.   The patient has received vascular, medical and cardiac clearance for surgery. All questions answered and the patient agrees with the above plan make preparations for her right total knee replacement.  Portions of this record have been created using Scientist, clinical (histocompatibility and immunogenetics). Dictation errors have been sought, but may not have been identified and corrected.  Reinaldo Berber MD

## 2023-05-09 NOTE — Discharge Summary (Signed)
Physician Discharge Summary  Patient ID: Julia Sherman MRN: [id number] DOB/AGE: 12/04/1945 77 y.o.  Admit date: 05/09/2023 Discharge date: 05/10/2023  Admission Diagnoses:  S/P TKR (total knee replacement) using cement, right [Z96.651]   Discharge Diagnoses: Patient Active Problem List   Diagnosis Date Noted   S/P TKR (total knee replacement) using cement, right 05/09/2023   Degeneration of lumbar intervertebral disc 04/03/2023   Fibromyalgia 04/03/2023   Osteoarthritis 04/03/2023   Spinal stenosis 11/08/2022   Hyperlipidemia 05/20/2020   GERD (gastroesophageal reflux disease) 10/12/2019   Major depressive disorder with psychotic features (HCC) 04/04/2017   Osteoporosis 08/06/2016    Past Medical History:  Diagnosis Date   Anxiety    Chronic sinusitis    DDD (degenerative disc disease), lumbar    Depression    Fibromyalgia    GERD (gastroesophageal reflux disease)    Heavy-for-dates infant    History of motion sickness    HLD (hyperlipidemia)    Hypertension    IBD (inflammatory bowel disease)    Osteoarthritis    Scoliosis    Skin cancer    White matter disease      Transfusion: none   Consultants (if any):   Discharged Condition: Improved  Hospital Course: Julia Sherman is an 77 y.o. female who was admitted 05/09/2023 with a diagnosis of S/P TKR (total knee replacement) using cement, right and went to the operating room on 05/09/2023 and underwent the above named procedures.    Surgeries: Procedure(s): TOTAL KNEE ARTHROPLASTY on 05/09/2023 Patient tolerated the surgery well. Taken to PACU where she was stabilized and then transferred to the orthopedic floor.  Started on Eliquis 2.5mg  BID. TEDs and SCDs applied bilaterally. Heels elevated on bed. No evidence of DVT. Negative Homan. Physical therapy started on day #1 for gait training and transfer. OT started day #1 for ADL and assisted devices.  Patient's IV  was d/c on day #1. Patient was able to safely and  independently complete all PT goals. PT recommending discharge to home.    On post op day #1 patient was stable and ready for discharge to home with HHPT.  Implants: Femur: Persona Size 4 CR   Tibia: Persona Size C w/ 14x61mm stem extension  Poly: 10mm MC  Patella: 26x7.24mm symmetric    She was given perioperative antibiotics:  Anti-infectives (From admission, onward)    Start     Dose/Rate Route Frequency Ordered Stop   05/09/23 1700  ceFAZolin (ANCEF) IVPB 2g/100 mL premix        2 g 200 mL/hr over 30 Minutes Intravenous Every 6 hours 05/09/23 1359 05/09/23 2343   05/09/23 0600  ceFAZolin (ANCEF) IVPB 2g/100 mL premix        2 g 200 mL/hr over 30 Minutes Intravenous On call to O.R. 05/09/23 0012 05/09/23 1145     .  She was given sequential compression devices, early ambulation, and Eliquis TEDs for DVT prophylaxis.  She benefited maximally from the hospital stay and there were no complications.    Recent vital signs:  Vitals:   05/10/23 0434 05/10/23 0727  BP: (!) 116/49 (!) 116/41  Pulse: 70 64  Resp: 16 17  Temp: 97.8 F (36.6 C) 97.7 F (36.5 C)  SpO2: 98% 97%    Recent laboratory studies:  Lab Results  Component Value Date   HGB 10.7 (L) 05/10/2023   HGB 13.7 05/01/2023   HGB 12.4 02/14/2022   Lab Results  Component Value Date   WBC 11.3 (H) 05/10/2023  PLT 159 05/10/2023   No results found for: "INR" Lab Results  Component Value Date   NA 142 05/10/2023   K 4.0 05/10/2023   CL 105 05/10/2023   CO2 23 05/10/2023   BUN 27 (H) 05/10/2023   CREATININE 1.03 (H) 05/10/2023   GLUCOSE 141 (H) 05/10/2023    Discharge Medications:   Allergies as of 05/10/2023       Reactions   Citrus    Codeine Itching   Enoxaparin Nausea And Vomiting   Novocain [procaine] Palpitations   Peanut Butter Flavor Nausea And Vomiting        Medication List     STOP taking these medications    acetaminophen 650 MG CR tablet Commonly known as: TYLENOL Replaced  by: acetaminophen 500 MG tablet       TAKE these medications    acetaminophen 500 MG tablet Commonly known as: TYLENOL Take 2 tablets (1,000 mg total) by mouth every 8 (eight) hours. Replaces: acetaminophen 650 MG CR tablet   apixaban 2.5 MG Tabs tablet Commonly known as: ELIQUIS Take 1 tablet (2.5 mg total) by mouth every 12 (twelve) hours for 14 days.   atorvastatin 80 MG tablet Commonly known as: LIPITOR Take 80 mg by mouth at bedtime.   celecoxib 100 MG capsule Commonly known as: CeleBREX Take 1 capsule (100 mg total) by mouth 2 (two) times daily for 14 days.   docusate sodium 100 MG capsule Commonly known as: COLACE Take 1 capsule (100 mg total) by mouth 2 (two) times daily.   EPINEPHrine 0.3 mg/0.3 mL Soaj injection Commonly known as: EPI-PEN Inject 0.3 mg into the muscle as needed for anaphylaxis.   furosemide 20 MG tablet Commonly known as: LASIX Take 30 mg by mouth daily in the afternoon.   methocarbamol 500 MG tablet Commonly known as: ROBAXIN Take 1 tablet (500 mg total) by mouth every 8 (eight) hours as needed for muscle spasms.   senna-docusate 8.6-50 MG tablet Commonly known as: Senokot-S Take 1 tablet by mouth daily as needed for mild constipation.   traMADol 50 MG tablet Commonly known as: ULTRAM Take 1 tablet (50 mg total) by mouth every 6 (six) hours as needed for moderate pain.   venlafaxine XR 37.5 MG 24 hr capsule Commonly known as: EFFEXOR-XR Take 37.5 mg by mouth at bedtime.   venlafaxine XR 75 MG 24 hr capsule Commonly known as: EFFEXOR-XR Take 75 mg by mouth at bedtime.        Diagnostic Studies: DG Knee 1-2 Views Right  Result Date: 05/09/2023 CLINICAL DATA:  Total right knee arthroplasty elective surgery. Immediate postoperative images. EXAM: RIGHT KNEE - 1-2 VIEW COMPARISON:  None Available. FINDINGS: Status post total right knee arthroplasty. No perihardware lucency is seen to indicate hardware failure or loosening. Small  joint effusion. Expected mild intra-articular and subcutaneous air postsurgical changes. No acute fracture or dislocation. IMPRESSION: Status post total right knee arthroplasty without evidence of hardware failure or loosening. Electronically Signed   By: Neita Garnet M.D.   On: 05/09/2023 15:42    Disposition:      Follow-up Information     Evon Slack, PA-C Follow up in 2 week(s).   Specialties: Orthopedic Surgery, Emergency Medicine Contact information: [address] [place] [address] [phone]                  Signed: Patience Musca 05/10/2023, 9:04 AM

## 2023-05-09 NOTE — Anesthesia Preprocedure Evaluation (Addendum)
Anesthesia Evaluation  Patient identified by MRN, date of birth, ID band Patient awake    Reviewed: Allergy & Precautions, NPO status , Patient's Chart, lab work & pertinent test results  History of Anesthesia Complications Negative for: history of anesthetic complications  Airway Mallampati: III   Neck ROM: Full    Dental  (+) Edentulous Upper, Edentulous Lower   Pulmonary former smoker (quit 3 years ago)   Pulmonary exam normal breath sounds clear to auscultation       Cardiovascular hypertension, Normal cardiovascular exam Rhythm:Regular Rate:Normal  ECG 05/01/23:  Normal sinus rhythm T wave abnormality, consider anterolateral ischemia   Neuro/Psych  PSYCHIATRIC DISORDERS Anxiety Depression    negative neurological ROS     GI/Hepatic ,GERD  ,,  Endo/Other  Obesity   Renal/GU negative Renal ROS     Musculoskeletal  (+) Arthritis ,  Fibromyalgia -  Abdominal   Peds  Hematology negative hematology ROS (+)   Anesthesia Other Findings   Reproductive/Obstetrics                             Anesthesia Physical Anesthesia Plan  ASA: 2  Anesthesia Plan: Spinal   Post-op Pain Management:    Induction: Intravenous  PONV Risk Score and Plan: 3 and Propofol infusion, TIVA, Treatment may vary due to age or medical condition and Ondansetron  Airway Management Planned: Natural Airway and Nasal Cannula  Additional Equipment:   Intra-op Plan:   Post-operative Plan:   Informed Consent: I have reviewed the patients History and Physical, chart, labs and discussed the procedure including the risks, benefits and alternatives for the proposed anesthesia with the patient or authorized representative who has indicated his/her understanding and acceptance.       Plan Discussed with: CRNA  Anesthesia Plan Comments: (Patient saw Dr. Juliann Pares for cardiology evaluation on 05/06/23 and he said she  was at acceptable risk for surgery without further cardiac workup.  Note is not yet finalized in Epic, however, so will plan to proceed with verbal approval -- discussed with Quentin Mulling and Dr. Audelia Acton.   Plan for spinal and GA with natural airway, LMA/GETA backup.  Patient consented for risks of anesthesia including but not limited to:  - adverse reactions to medications - damage to eyes, teeth, lips or other oral mucosa - nerve damage due to positioning  - sore throat or hoarseness - headache, bleeding, infection, nerve damage 2/2 spinal - damage to heart, brain, nerves, lungs, other parts of body or loss of life  Informed patient about role of CRNA in peri- and intra-operative care.  Patient voiced understanding.)        Anesthesia Quick Evaluation

## 2023-05-09 NOTE — Interval H&P Note (Signed)
Patient history and physical updated. Consent reviewed including risks, benefits, and alternatives to surgery. Patient agrees with above plan to proceed with right total knee arthroplasty. We did review that she has some clean appearing scratches on her right leg, with no signs of infection, advised her to avoid any scratching after surgery to reduce risk of any infection. Patient acknowledged.

## 2023-05-09 NOTE — Op Note (Signed)
Patient Name: Julia Sherman  ZOX:[id number]  Pre-Operative Diagnosis: Right knee Osteoarthritis  Post-Operative Diagnosis: (same)  Procedure: Right Total Knee Arthroplasty  Components/Implants: Femur: Persona Size 4 CR   Tibia: Persona Size C w/ 14x44mm stem extension  Poly: 10mm MC  Patella: 26x7.66mm symmetric  Femoral Valgus Cut Angle: 5 degreees  Distal Femoral Re-cut: none  Patella Resurfacing: yes   Date of Surgery: 05/09/2023  Surgeon: Reinaldo Berber MD  Assistant: Amador Cunas pA (present and scrubbed throughout the case, critical for assistance with exposure, retraction, instrumentation, and closure)   Anesthesiologist: Mazzoni  Anesthesia: Spinal   Tourniquet Time: 72 min  EBL: 50cc  IVF: 800cc  Complications: None   Brief history: The patient is a 77 year old female with a history of osteoarthritis of the right knee with pain limiting their range of motion and activities of daily living, which has failed multiple attempts at conservative therapy.  The risks and benefits of total knee arthroplasty as definitive surgical treatment were discussed with the patient, who opted to proceed with the operation.  After outpatient medical clearance and optimization was completed the patient was admitted to [facility] for the procedure.  All preoperative films were reviewed and an appropriate surgical plan was made prior to surgery. Preoperative range of motion was 0 to 115. The patient was identified as having a varus alignment with severe bone loss of the patella.   Description of procedure: The patient was brought to the operating room where laterality was confirmed by all those present to be the right side.   Spinal anesthesia was administered and the patient received an intravenous dose of antibiotics for surgical prophylaxis and a dose of tranexamic acid.  Patient is positioned supine on the operating room table with all bony prominences well-padded.   A well-padded tourniquet was applied to the right thigh.  The knee was then prepped and draped in usual sterile fashion with multiple layers of adhesive and nonadhesive drapes.  All of those present in the operating room participated in a surgical timeout laterality and patient were confirmed.   An Esmarch was wrapped around the extremity and the leg was elevated and the knee flexed.  The tourniquet was inflated to a pressure of 275 mmHg. The Esmarch was removed and the leg was brought down to full extension.  The patella and tibial tubercle identified and outlined using a marking pen and a midline skin incision was made with a knife carried through the subcutaneous tissue down to the extensor retinaculum.  After exposure of the extensor mechanism the medial parapatellar arthrotomy was performed with a scalpel and electrocautery extending down medial and distal to the tibial tubercle taking care to avoid incising the patellar tendon.   A standard medial release was performed over the proximal tibia.  The knee was brought into extension in order to excise the fat pad taking care not to damage the patella tendon.  The superior soft tissue was removed from the anterior surface of the distal femur to visualize for the procedure.  The knee was then brought into flexion with the patella subluxed laterally and subluxing the tibia anteriorly.  The ACL was transected and removed with electrocautery and additional soft tissue was removed from the proximal surface of the tibia to fully expose. The PCL was found to be intact and was preserved.  An extramedullary tibial cutting guide was then applied to the leg with a spring-loaded ankle clamp placed around the distal tibia just above the malleoli the  angulation of the guide was adjusted to give some posterior slope in the tibial resection with an appropriate varus/valgus alignment.  The resection guide was then pinned to the proximal tibia and the proximal tibial surface  was resected with an oscillating saw.  Careful attention was paid to ensure the blade did not disrupt any of the soft tissues including any lateral or medial ligament.  Attention was then turned to the femur, with the knee slightly flexed a opening drill was used to enter the medullary canal of the femur.  After removing the drill marrow was suctioned out to decompress the distal femur.  An intramedullary femoral guide was then inserted into the drill hole and the alignment guide was seated firmly against the distal end of the medial femoral condyle.  The distal femoral cutting guide was then attached and pinned securely to the anterior surface of the femur and the intramedullary rod and alignment guide was removed.  Distal femur resection was then performed with an oscillating saw with retractors protecting medial and laterally.   The distal cutting block was then removed and the extension gap was checked with a spacer.  Extension gap was found to be appropriately sized to accommodate the spacer block.   The femoral sizing guide was then placed securely into the posterior condyles of the femur and the femoral size was measured and determined to be 4.  The size 4; 4-in-1 cutting guide was placed in position and secured with 2 pins.  The anterior posterior and chamfer resections were then performed with an oscillating saw.  Bony fragments and osteophytes were then removed.  Using a lamina spreader the posterior medial and lateral condyles were checked for additional osteophytes and posterior soft tissue remnants.  Any remaining meniscus was removed at this time.  Periarticular injection was performed in the meniscal rims and posterior capsule with aspiration performed to ensure no intravascular injection.   The tibia was then exposed and the tibial trial was pinned onto the plateau after confirming appropriate orientation and rotation.  Using the drill bushing the tibia was prepared to the appropriate drill  depth.  Tibial broach impactor was then driven through the punch guide using a mallet.  The femoral trial component was then inserted onto the femur.  A trial tibial polyethylene bearing was then placed and the knee was reduced.  The knee achieved full extension with no hyperextension and was found to be balanced in flexion and extension with the trials in place.  The knee was then brought into full extension the patella was everted and held with 2 Kocher clamps.  The articular surface of the patella was then resected with an patella reamer and saw after careful measurement with a caliper. There was found to be severe bone loss of the patella, but there was enough bone remaining to fully cement a 26mm button without overhang or over thinning the patella, leaving 12mm of thickness at the level of the patella pegs.  The patella was then prepared with the drill guide and a trial patella was placed.  The knee was then taken through range of motion and it was found that the patella articulated appropriately with the trochlea and good patellofemoral motion without subluxation.    The correct final components for implantation were confirmed and opened by the circulator nurse.  The prepared surfaces of the patella femur and tibia were cleaned with pulsatile lavage to remove all blood fat and other material and then the surfaces were dried.  2 bags of cement were mixed under vacuum and the components were cemented into place.  Excess cement was removed with curettes and forceps. A trial polyethylene tibial component was placed and the knee was brought into extension to allow the cement to set.  At this time the periarticular injection cocktail was placed in the soft tissues surrounding the knee.  After full curing of the cement the balance of the knee was checked again and the final polyethylene size was confirmed. The tibial component was irrigated and locking mechanism checked to ensure it was clear of debris. The real  polyethylene tibial component was implanted and the knee was brought through a range of motion.   The knee was then irrigated with copious amount of normal saline via pulsatile lavage to remove all loose bodies and other debris.  The knee was then irrigated with surgiphor betadine based wash and reirrigated with saline.  The tourniquet was then dropped and all bleeding vessels were identified and coagulated.  The arthrotomy was approximated with #1 Vicryl and closed with #2 Quill suture.  The knee was brought into slight flexion and the subcutaneous tissues were closed with 0 Vicryl, 2-0 Vicryl and a running subcuticular 3-0 monoderm quil suture.  Skin was then glued with Dermabond.  A sterile adhesive dressing was then placed along with a sequential compression device to the calf, a Ted stocking, and a cryotherapy cuff.   Sponge, needle, and Lap counts were all correct at the end of the case.   The patient was transferred off of the operating room table to a hospital bed, good pulses were found distally on the operative side.  The patient was transferred to the recovery room in stable condition.

## 2023-05-09 NOTE — Anesthesia Procedure Notes (Addendum)
Spinal  Patient location during procedure: OR Start time: 05/09/2023 12:49 PM End time: 05/09/2023 11:10 AM Reason for block: surgical anesthesia Staffing Performed: anesthesiologist  Anesthesiologist: Reed Breech, MD Resident/CRNA: Lynden Oxford, CRNA Performed by: Lynden Oxford, CRNA Authorized by: Reed Breech, MD   Preanesthetic Checklist Completed: patient identified, IV checked, site marked, risks and benefits discussed, surgical consent, monitors and equipment checked, pre-op evaluation and timeout performed Spinal Block Patient position: sitting Prep: DuraPrep Patient monitoring: heart rate, cardiac monitor, continuous pulse ox and blood pressure Approach: midline Location: L3-4 Injection technique: single-shot Needle Needle type: Pencan  Needle gauge: 22 G Needle length: 9 cm Assessment Sensory level: T4 Events: CSF return Additional Notes Multiple attempts.  Patient with scoliosis and short stature.  CSF return noted with needle inserted 3 cm to right of midline of back at approximately L3-4 level.

## 2023-05-09 NOTE — Plan of Care (Signed)
  Problem: Education: Goal: Knowledge of the prescribed therapeutic regimen will improve Outcome: Progressing Goal: Individualized Educational Video(s) Outcome: Progressing   Problem: Activity: Goal: Ability to avoid complications of mobility impairment will improve Outcome: Progressing Goal: Range of joint motion will improve Outcome: Progressing   Problem: Pain Management: Goal: Pain level will decrease with appropriate interventions Outcome: Progressing   Problem: Clinical Measurements: Goal: Postoperative complications will be avoided or minimized Outcome: Progressing   Problem: Health Behavior/Discharge Planning: Goal: Ability to manage health-related needs will improve Outcome: Progressing   Problem: Clinical Measurements: Goal: Ability to maintain clinical measurements within normal limits will improve Outcome: Progressing Goal: Will remain free from infection Outcome: Progressing Goal: Diagnostic test results will improve Outcome: Progressing Goal: Respiratory complications will improve Outcome: Progressing Goal: Cardiovascular complication will be avoided Outcome: Progressing   Problem: Elimination: Goal: Will not experience complications related to bowel motility Outcome: Progressing Goal: Will not experience complications related to urinary retention Outcome: Progressing   Problem: Coping: Goal: Level of anxiety will decrease Outcome: Progressing   Problem: Nutrition: Goal: Adequate nutrition will be maintained Outcome: Progressing   Problem: Safety: Goal: Ability to remain free from injury will improve Outcome: Progressing

## 2023-05-09 NOTE — Transfer of Care (Signed)
Immediate Anesthesia Transfer of Care Note  Patient: Julia Sherman  Procedure(s) Performed: TOTAL KNEE ARTHROPLASTY (Right: Knee)  Patient Location: PACU  Anesthesia Type:General and Spinal  Level of Consciousness: drowsy and patient cooperative  Airway & Oxygen Therapy: Patient Spontanous Breathing and Patient connected to face mask oxygen  Post-op Assessment: Report given to RN and Post -op Vital signs reviewed and stable  Post vital signs: Reviewed and stable  Last Vitals:  Vitals Value Taken Time  BP 93/53 05/09/23 1330  Temp    Pulse 64 05/09/23 1333  Resp 24 05/09/23 1333  SpO2 95 % 05/09/23 1333  Vitals shown include unfiled device data.  Last Pain:  Vitals:   05/09/23 0927  TempSrc: Oral  PainSc: 0-No pain         Complications: No notable events documented.

## 2023-05-09 NOTE — Anesthesia Postprocedure Evaluation (Signed)
Anesthesia Post Note  Patient: Julia Sherman  Procedure(s) Performed: TOTAL KNEE ARTHROPLASTY (Right: Knee)  Patient location during evaluation: PACU Anesthesia Type: Spinal Level of consciousness: awake and alert, oriented and patient cooperative Pain management: pain level controlled Vital Signs Assessment: post-procedure vital signs reviewed and stable Respiratory status: spontaneous breathing, nonlabored ventilation and respiratory function stable Cardiovascular status: blood pressure returned to baseline and stable Postop Assessment: adequate PO intake, no headache, no backache and spinal receding Anesthetic complications: no   No notable events documented.   Last Vitals:  Vitals:   05/09/23 1345 05/09/23 1400  BP: (!) 95/58 (!) 71/54  Pulse: 66 66  Resp: (!) 23 (!) 31  Temp:    SpO2: 90% 93%    Last Pain:  Vitals:   05/09/23 1400  TempSrc:   PainSc: 0-No pain                 Reed Breech

## 2023-05-09 NOTE — Discharge Instructions (Signed)
Instructions after Total Knee Replacement   Reinaldo Berber M.D.     Dept. of Orthopaedics & Sports Medicine  [facility]  [address]  [place], [place]  [address]  Phone: [phone]   Fax: [phone]    DIET: Drink plenty of non-alcoholic fluids. Resume your normal diet. Include foods high in fiber.  ACTIVITY:  You may use crutches or a walker with weight-bearing as tolerated, unless instructed otherwise. You may be weaned off of the walker or crutches by your Physical Therapist.  Do NOT place pillows under the knee. Anything placed under the knee could limit your ability to straighten the knee.   Continue doing gentle exercises. Exercising will reduce the pain and swelling, increase motion, and prevent muscle weakness.   Please continue to use the TED compression stockings for 2 weeks. You may remove the stockings at night, but should reapply them in the morning. Do not drive or operate any equipment until instructed.  WOUND CARE:  Continue to use the PolarCare or ice packs periodically to reduce pain and swelling. You may begin showering 3 days after surgery with honeycomb dressing. Remove honeycomb dressing 7 days after surgery and continue showering. Allow dermabond to fall off on its own.  MEDICATIONS: You may resume your regular medications. Please take the pain medication as prescribed on the medication. Do not take pain medication on an empty stomach. You have been given a prescription for a blood thinner (Eliquis). Please take the medication as instructed. (NOTE: After completing a 2 week course of Eliquis, take one 81 mg Enteric-coated aspirin twice a day for 3 additional weeks. This along with elevation will help reduce the possibility of phlebitis in your operated leg.) Do not drive or drink alcoholic beverages when taking pain medications.  POSTOPERATIVE CONSTIPATION PROTOCOL Constipation - defined medically as fewer than three stools per week and  severe constipation as less than one stool per week.  One of the most common issues patients have following surgery is constipation.  Even if you have a regular bowel pattern at home, your normal regimen is likely to be disrupted due to multiple reasons following surgery.  Combination of anesthesia, postoperative narcotics, change in appetite and fluid intake all can affect your bowels.  In order to avoid complications following surgery, here are some recommendations in order to help you during your recovery period.  Colace (docusate) - Pick up an over-the-counter form of Colace or another stool softener and take twice a day as long as you are requiring postoperative pain medications.  Take with a full glass of water daily.  If you experience loose stools or diarrhea, hold the colace until you stool forms back up.  If your symptoms do not get better within 1 week or if they get worse, check with your doctor.  Dulcolax (bisacodyl) - Pick up over-the-counter and take as directed by the product packaging as needed to assist with the movement of your bowels.  Take with a full glass of water.  Use this product as needed if not relieved by Colace only.   MiraLax (polyethylene glycol) - Pick up over-the-counter to have on hand.  MiraLax is a solution that will increase the amount of water in your bowels to assist with bowel movements.  Take as directed and can mix with a glass of water, juice, soda, coffee, or tea.  Take if you go more than two days without a movement. Do not use MiraLax more than once per day. Call your  doctor if you are still constipated or irregular after using this medication for 7 days in a row.  If you continue to have problems with postoperative constipation, please contact the office for further assistance and recommendations.  If you experience "the worst abdominal pain ever" or develop nausea or vomiting, please contact the office immediatly for further recommendations for  treatment.   CALL THE OFFICE FOR: Temperature above 101 degrees Excessive bleeding or drainage on the dressing. Excessive swelling, coldness, or paleness of the toes. Persistent nausea and vomiting.  FOLLOW-UP:  You should have an appointment to return to the office in 14 days after surgery. Arrangements have been made for continuation of Physical Therapy (either home therapy or outpatient therapy).

## 2023-05-10 ENCOUNTER — Encounter: Payer: Self-pay | Admitting: Orthopedic Surgery

## 2023-05-10 LAB — CBC: RBC: 3.54 MIL/uL — ABNORMAL LOW (ref 3.87–5.11)

## 2023-05-10 MED ORDER — CELECOXIB 100 MG PO CAPS: 100.0000 mg | ORAL_CAPSULE | Freq: Two times a day (BID) | ORAL | 0 refills | Status: AC

## 2023-05-10 MED ORDER — DOCUSATE SODIUM 100 MG PO CAPS: 100.0000 mg | ORAL_CAPSULE | Freq: Two times a day (BID) | ORAL | 0 refills | Status: AC

## 2023-05-10 MED ORDER — METHOCARBAMOL 500 MG PO TABS: 500.0000 mg | ORAL_TABLET | Freq: Three times a day (TID) | ORAL | 0 refills | Status: AC | PRN

## 2023-05-10 MED ORDER — APIXABAN 2.5 MG PO TABS: 2.5000 mg | ORAL_TABLET | Freq: Two times a day (BID) | ORAL | 0 refills | Status: AC

## 2023-05-10 MED ORDER — ACETAMINOPHEN 500 MG PO TABS: 1000.0000 mg | ORAL_TABLET | Freq: Three times a day (TID) | ORAL | 0 refills | Status: AC

## 2023-05-10 MED ORDER — TRAMADOL HCL 50 MG PO TABS: 50.0000 mg | ORAL_TABLET | Freq: Four times a day (QID) | ORAL | 0 refills | Status: AC | PRN

## 2023-05-10 NOTE — Progress Notes (Signed)
Physical Therapy Treatment Patient Details Name: Julia Sherman MRN: [id number] DOB: 1946-09-18 Today's Date: 05/10/2023   History of Present Illness Pt 77 y/o female s/p R TKA 7/25.    PT Comments  Patient supine in bed upon arrival, agreeable to PT tx session this date. Patient is POD1. During tx session, patient Mod I with bed mobility and able to ambulate/transfer with supervision and RW. Patient able to ambulate 175 ft with RW. No complaints of pain. Patient reports it felt good to ambulate, better than it did prior to surgery. Patient left in recliner with proper positioning, chair alarm set, and all needs in reach. Patient is progressing well with acute PT services, will continue to follow acutely.     Assistance Recommended at Discharge Intermittent Supervision/Assistance  If plan is discharge home, recommend the following:  Can travel by private vehicle    A little help with walking and/or transfers;A little help with bathing/dressing/bathroom;Assistance with cooking/housework;Assist for transportation   Yes  Equipment Recommendations  None recommended by PT    Recommendations for Other Services       Precautions / Restrictions Precautions Precautions: Fall Restrictions Weight Bearing Restrictions: No     Mobility  Bed Mobility Overal bed mobility: Modified Independent             General bed mobility comments: Patient able to get to EOB with [facility] slgihtly elevated Mod I, patient require increased time for completion but no physical assistance required.    Transfers Overall transfer level: Modified independent Equipment used: Rolling walker (2 wheels)               General transfer comment: Pt able to stand from EOB Mod I, no physical assistance. Denies lightheadedness. Patient also able to stand from toilet (low height) without assistance.    Ambulation/Gait Ambulation/Gait assistance: Supervision Gait Distance (Feet): 175 Feet Assistive device:  Rolling walker (2 wheels)   Gait velocity: Decreased     General Gait Details: Completed ambulation 175 ft with RW, including ambulation from bed to toilet and then around nursing station x 2 laps before returning to room and requesting to sit in recliner at end of session.   Stairs             Wheelchair Mobility     Tilt Bed    Modified Rankin (Stroke Patients Only)       Balance Overall balance assessment: Modified Independent                                          Cognition Arousal/Alertness: Awake/alert Behavior During Therapy: WFL for tasks assessed/performed Overall Cognitive Status: Within Functional Limits for tasks assessed                                          Exercises Total Joint Exercises Knee Flexion: AROM, 5 reps Goniometric ROM: 0-108 Other Exercises Other Exercises: Reveiwed post total knee HEP and educated on completion.    General Comments        Pertinent Vitals/Pain Pain Assessment Pain Assessment: No/denies pain    Home Living                          Prior Function  PT Goals (current goals can now be found in the care plan section) Acute Rehab PT Goals Patient Stated Goal: Get home today PT Goal Formulation: With patient Time For Goal Achievement: 05/22/23 Potential to Achieve Goals: Good Progress towards PT goals: Progressing toward goals    Frequency    BID      PT Plan Current plan remains appropriate    Co-evaluation              AM-PAC PT "6 Clicks" Mobility   Outcome Measure  Help needed turning from your back to your side while in a flat bed without using bedrails?: None Help needed moving from lying on your back to sitting on the side of a flat bed without using bedrails?: None Help needed moving to and from a bed to a chair (including a wheelchair)?: None Help needed standing up from a chair using your arms (e.g., wheelchair or bedside  chair)?: None Help needed to walk in hospital room?: A Little Help needed climbing 3-5 steps with a railing? : A Little 6 Click Score: 22    End of Session Equipment Utilized During Treatment: Gait belt Activity Tolerance: Patient tolerated treatment well Patient left: in chair;with chair alarm set;with call bell/phone within reach Nurse Communication: Mobility status PT Visit Diagnosis: Muscle weakness (generalized) (M62.81);Difficulty in walking, not elsewhere classified (R26.2);Pain Pain - Right/Left: Right Pain - part of body: Knee     Time: 7829-5621 PT Time Calculation (min) (ACUTE ONLY): 25 min  Charges:    $Gait Training: 8-22 mins PT General Charges $$ ACUTE PT VISIT: 1 Visit                     Creed Copper Fairly, PT, DPT 05/10/23 9:16 AM

## 2023-05-10 NOTE — Plan of Care (Signed)

## 2023-05-10 NOTE — TOC Transition Note (Signed)
Transition of Care [facility]) - CM/SW Discharge Note   Patient Details  Name: Julia Sherman MRN: [id number] Date of Birth: 04/27/1946  Transition of Care [facility]) CM/SW Contact:  Garret Reddish, RN Phone Number: 05/10/2023, 10:45 AM   Clinical Narrative:   Chart reviewed.  Patient has orders for discharge today.    Patient has been pre-arranged with [facility]. Patient would like to continue to use [facility] for Home Health services on discharge. I have informed Kandee Keen with [facility] that patient has orders for discharge today.    Patient has all needed DME.    Patient's family will transport patient home today.     Final next level of care: Home w Home Health Services Barriers to Discharge: No Barriers Identified   Patient Goals and CMS Choice CMS Medicare.gov Compare Post Acute Care list provided to:: Patient Choice offered to / list presented to : Patient  Discharge Placement                      Patient and family notified of of transfer: 05/10/23  Discharge Plan and Services Additional resources added to the After Visit Summary for                  DME Arranged:  (Patient has all needed DME)         HH Arranged: PT, OT (Patient was pre-arranged with [facility].) [facility] Agency: [facility] Date [facility] Agency Contacted: 05/10/23   Representative spoke with at [facility] Agency: Kandee Keen  Social Determinants of Health (SDOH) Interventions SDOH Screenings   Food Insecurity: No Food Insecurity (05/09/2023)  Housing: Low Risk  (05/09/2023)  Transportation Needs: No Transportation Needs (05/09/2023)  Utilities: Not At Risk (05/09/2023)  Financial Resource Strain: Low Risk  (05/03/2023)   [facility]  Tobacco Use: High Risk (05/06/2023)   Received from [facility]     Readmission Risk Interventions     No data to display

## 2023-05-10 NOTE — Progress Notes (Addendum)
   Subjective: 1 Day Post-Op Procedure(s) (LRB): TOTAL KNEE ARTHROPLASTY (Right) Patient reports pain as mild.   Patient is well, and has had no acute complaints or problems Denies any CP, SOB, ABD pain. We will continue therapy today.  Plan is to go Home after hospital stay.  Objective: Vital signs in last 24 hours: Temp:  [97.2 F (36.2 C)-98.4 F (36.9 C)] 97.7 F (36.5 C) (07/26 0727) Pulse Rate:  [63-89] 64 (07/26 0727) Resp:  [9-31] 17 (07/26 0727) BP: (90-159)/(41-81) 116/41 (07/26 0727) SpO2:  [90 %-98 %] 97 % (07/26 0727) Weight:  [75.3 kg] 75.3 kg (07/25 0927)  Intake/Output from previous day: 07/25 0701 - 07/26 0700 In: 2854.9 [P.O.:270; I.V.:2384.9; IV Piggyback:200] Out: 50 [Blood:50] Intake/Output this shift: No intake/output data recorded.  Recent Labs    05/10/23 0309  HGB 10.7*   Recent Labs    05/10/23 0309  WBC 11.3*  RBC 3.54*  HCT 32.5*  PLT 159   Recent Labs    05/10/23 0309  NA 142  K 4.0  CL 105  CO2 23  BUN 27*  CREATININE 1.03*  GLUCOSE 141*  CALCIUM 8.7*   No results for input(s): "LABPT", "INR" in the last 72 hours.  EXAM General - Patient is Alert, Appropriate, and Oriented Extremity - Neurovascular intact Sensation intact distally Intact pulses distally Dorsiflexion/Plantar flexion intact No cellulitis present Compartment soft Dressing - dressing C/D/I and no drainage Motor Function - intact, moving foot and toes well on exam.   Past Medical History:  Diagnosis Date   Anxiety    Chronic sinusitis    DDD (degenerative disc disease), lumbar    Depression    Fibromyalgia    GERD (gastroesophageal reflux disease)    Heavy-for-dates infant    History of motion sickness    HLD (hyperlipidemia)    Hypertension    IBD (inflammatory bowel disease)    Osteoarthritis    Scoliosis    Skin cancer    White matter disease     Assessment/Plan:   1 Day Post-Op Procedure(s) (LRB): TOTAL KNEE ARTHROPLASTY  (Right) Principal Problem:   S/P TKR (total knee replacement) using cement, right  Estimated body mass index is 34.69 kg/m as calculated from the following:   Height as of this encounter: 4\' 10"  (1.473 m).   Weight as of this encounter: 75.3 kg. Advance diet Up with therapy Pain well controlled Labs and VSS CM to assist with discharge to home with HHPT today  DVT Prophylaxis - TED hose and SCDs Eliquis Weight-Bearing as tolerated to right leg   T. Cranston Neighbor, PA-C [facility] 05/10/2023, 8:54 AM   Patient seen and examined, agree with above plan.  The patient is doing well status post right total knee arthroplasty, no concerns at this time.  Pain is controlled.  Discussed DVT prophylaxis, pain medication use, and safe transition to home.  All questions answered the patient agrees with above plan will go home after clears PT.   Reinaldo Berber MD

## 2023-05-20 ENCOUNTER — Ambulatory Visit
Admission: RE | Admit: 2023-05-20 | Discharge: 2023-05-20 | Disposition: A | Discharge status: Home / Self Care | Payer: Medicare HMO | Source: Ambulatory Visit | Attending: Student | Admitting: Student

## 2023-05-20 ENCOUNTER — Other Ambulatory Visit: Payer: Self-pay | Admitting: Student

## 2023-05-20 DIAGNOSIS — M7989 Other specified soft tissue disorders: Secondary | ICD-10-CM | POA: Insufficient documentation

## 2024-03-03 ENCOUNTER — Encounter (INDEPENDENT_AMBULATORY_CARE_PROVIDER_SITE_OTHER): Payer: Self-pay
